# Patient Record
Sex: Male | Born: 1940 | Race: White | Hispanic: No | Marital: Married | Smoking: Current every day smoker
Health system: Southern US, Community
[De-identification: ages and names within clinical notes are randomized; demographics above are authoritative.]

## PROBLEM LIST (undated history)

## (undated) DIAGNOSIS — I701 Atherosclerosis of renal artery: Secondary | ICD-10-CM

## (undated) DIAGNOSIS — I712 Thoracic aortic aneurysm, without rupture, unspecified: Secondary | ICD-10-CM

## (undated) DIAGNOSIS — Z720 Tobacco use: Secondary | ICD-10-CM

## (undated) DIAGNOSIS — I2510 Atherosclerotic heart disease of native coronary artery without angina pectoris: Secondary | ICD-10-CM

## (undated) DIAGNOSIS — I10 Essential (primary) hypertension: Secondary | ICD-10-CM

## (undated) DIAGNOSIS — N419 Inflammatory disease of prostate, unspecified: Secondary | ICD-10-CM

## (undated) DIAGNOSIS — R0989 Other specified symptoms and signs involving the circulatory and respiratory systems: Secondary | ICD-10-CM

## (undated) DIAGNOSIS — R630 Anorexia: Secondary | ICD-10-CM

## (undated) DIAGNOSIS — I672 Cerebral atherosclerosis: Secondary | ICD-10-CM

## (undated) DIAGNOSIS — I714 Abdominal aortic aneurysm, without rupture, unspecified: Secondary | ICD-10-CM

## (undated) DIAGNOSIS — R5383 Other fatigue: Secondary | ICD-10-CM

## (undated) DIAGNOSIS — M5410 Radiculopathy, site unspecified: Secondary | ICD-10-CM

## (undated) DIAGNOSIS — N529 Male erectile dysfunction, unspecified: Secondary | ICD-10-CM

## (undated) DIAGNOSIS — E785 Hyperlipidemia, unspecified: Secondary | ICD-10-CM

## (undated) DIAGNOSIS — I509 Heart failure, unspecified: Secondary | ICD-10-CM

## (undated) DIAGNOSIS — I639 Cerebral infarction, unspecified: Secondary | ICD-10-CM

## (undated) HISTORY — DX: Inflammatory disease of prostate, unspecified: N41.9

## (undated) HISTORY — DX: Anorexia: R63.0

## (undated) HISTORY — DX: Atherosclerosis of renal artery: I70.1

## (undated) HISTORY — DX: Thoracic aortic aneurysm, without rupture, unspecified: I71.20

## (undated) HISTORY — DX: Tobacco use: Z72.0

## (undated) HISTORY — DX: Radiculopathy, site unspecified: M54.10

## (undated) HISTORY — DX: Hyperlipidemia, unspecified: E78.5

## (undated) HISTORY — DX: Heart failure, unspecified: I50.9

## (undated) HISTORY — DX: Abdominal aortic aneurysm, without rupture, unspecified: I71.40

## (undated) HISTORY — DX: Essential (primary) hypertension: I10

## (undated) HISTORY — DX: Male erectile dysfunction, unspecified: N52.9

## (undated) HISTORY — DX: Other fatigue: R53.83

## (undated) HISTORY — DX: Abdominal aortic aneurysm, without rupture: I71.4

## (undated) HISTORY — DX: Other specified symptoms and signs involving the circulatory and respiratory systems: R09.89

## (undated) HISTORY — DX: Atherosclerotic heart disease of native coronary artery without angina pectoris: I25.10

## (undated) HISTORY — DX: Cerebral infarction, unspecified: I63.9

## (undated) HISTORY — DX: Thoracic aortic aneurysm, without rupture: I71.2

## (undated) HISTORY — DX: Cerebral atherosclerosis: I67.2

---

## 1997-10-03 ENCOUNTER — Ambulatory Visit (HOSPITAL_COMMUNITY): Admission: RE | Admit: 1997-10-03 | Discharge: 1997-10-03 | Payer: Self-pay | Admitting: Gastroenterology

## 1998-10-27 ENCOUNTER — Ambulatory Visit (HOSPITAL_COMMUNITY): Admission: RE | Admit: 1998-10-27 | Discharge: 1998-10-27 | Payer: Self-pay | Admitting: Cardiology

## 1998-10-27 ENCOUNTER — Encounter: Payer: Self-pay | Admitting: Cardiology

## 1998-10-27 HISTORY — PX: CARDIAC CATHETERIZATION: SHX172

## 1998-11-08 ENCOUNTER — Encounter: Payer: Self-pay | Admitting: Surgery

## 1998-11-10 ENCOUNTER — Inpatient Hospital Stay (HOSPITAL_COMMUNITY): Admission: RE | Admit: 1998-11-10 | Discharge: 1998-11-16 | Payer: Self-pay | Admitting: Surgery

## 1998-11-10 ENCOUNTER — Encounter: Payer: Self-pay | Admitting: Surgery

## 1998-11-11 ENCOUNTER — Encounter: Payer: Self-pay | Admitting: Surgery

## 1998-11-12 ENCOUNTER — Encounter: Payer: Self-pay | Admitting: Surgery

## 1998-11-13 ENCOUNTER — Encounter: Payer: Self-pay | Admitting: Surgery

## 1998-11-14 ENCOUNTER — Encounter: Payer: Self-pay | Admitting: Surgery

## 1999-03-05 HISTORY — PX: THORACIC AORTIC ANEURYSM REPAIR: SHX799

## 2007-05-07 ENCOUNTER — Ambulatory Visit (HOSPITAL_BASED_OUTPATIENT_CLINIC_OR_DEPARTMENT_OTHER): Admission: RE | Admit: 2007-05-07 | Discharge: 2007-05-07 | Payer: Self-pay | Admitting: General Surgery

## 2007-05-07 ENCOUNTER — Encounter (INDEPENDENT_AMBULATORY_CARE_PROVIDER_SITE_OTHER): Payer: Self-pay | Admitting: General Surgery

## 2007-10-19 ENCOUNTER — Emergency Department (HOSPITAL_COMMUNITY): Admission: EM | Admit: 2007-10-19 | Discharge: 2007-10-19 | Payer: Self-pay | Admitting: *Deleted

## 2007-11-04 ENCOUNTER — Ambulatory Visit: Payer: Self-pay | Admitting: Vascular Surgery

## 2007-11-06 ENCOUNTER — Ambulatory Visit: Payer: Self-pay | Admitting: Vascular Surgery

## 2007-11-06 ENCOUNTER — Ambulatory Visit (HOSPITAL_COMMUNITY): Admission: RE | Admit: 2007-11-06 | Discharge: 2007-11-06 | Payer: Self-pay | Admitting: Vascular Surgery

## 2007-11-18 DIAGNOSIS — I509 Heart failure, unspecified: Secondary | ICD-10-CM

## 2007-11-18 DIAGNOSIS — I10 Essential (primary) hypertension: Secondary | ICD-10-CM

## 2007-11-18 HISTORY — DX: Essential (primary) hypertension: I10

## 2007-11-18 HISTORY — DX: Heart failure, unspecified: I50.9

## 2007-11-25 ENCOUNTER — Ambulatory Visit: Payer: Self-pay | Admitting: Vascular Surgery

## 2007-12-08 ENCOUNTER — Encounter: Payer: Self-pay | Admitting: Vascular Surgery

## 2007-12-08 ENCOUNTER — Ambulatory Visit: Payer: Self-pay | Admitting: Vascular Surgery

## 2007-12-08 ENCOUNTER — Inpatient Hospital Stay (HOSPITAL_COMMUNITY): Admission: RE | Admit: 2007-12-08 | Discharge: 2007-12-14 | Payer: Self-pay | Admitting: Vascular Surgery

## 2007-12-08 HISTORY — PX: ABDOMINAL AORTIC ANEURYSM REPAIR: SUR1152

## 2007-12-08 HISTORY — PX: PR VEIN BYPASS GRAFT,AORTO-FEM-POP: 35551

## 2007-12-30 ENCOUNTER — Ambulatory Visit: Payer: Self-pay | Admitting: Vascular Surgery

## 2008-06-29 ENCOUNTER — Encounter: Admission: RE | Admit: 2008-06-29 | Discharge: 2008-06-29 | Payer: Self-pay | Admitting: Vascular Surgery

## 2008-06-29 ENCOUNTER — Ambulatory Visit: Payer: Self-pay | Admitting: Vascular Surgery

## 2009-01-20 IMAGING — CR DG CHEST 1V PORT
1 series · 1 of 1 positions shown · non-contrast
Comparison: 12/02/2007.

CLINICAL DATA: Status post repair of abdominal aortic aneurysm.

PORTABLE CHEST - 1 VIEW

[view not recorded]
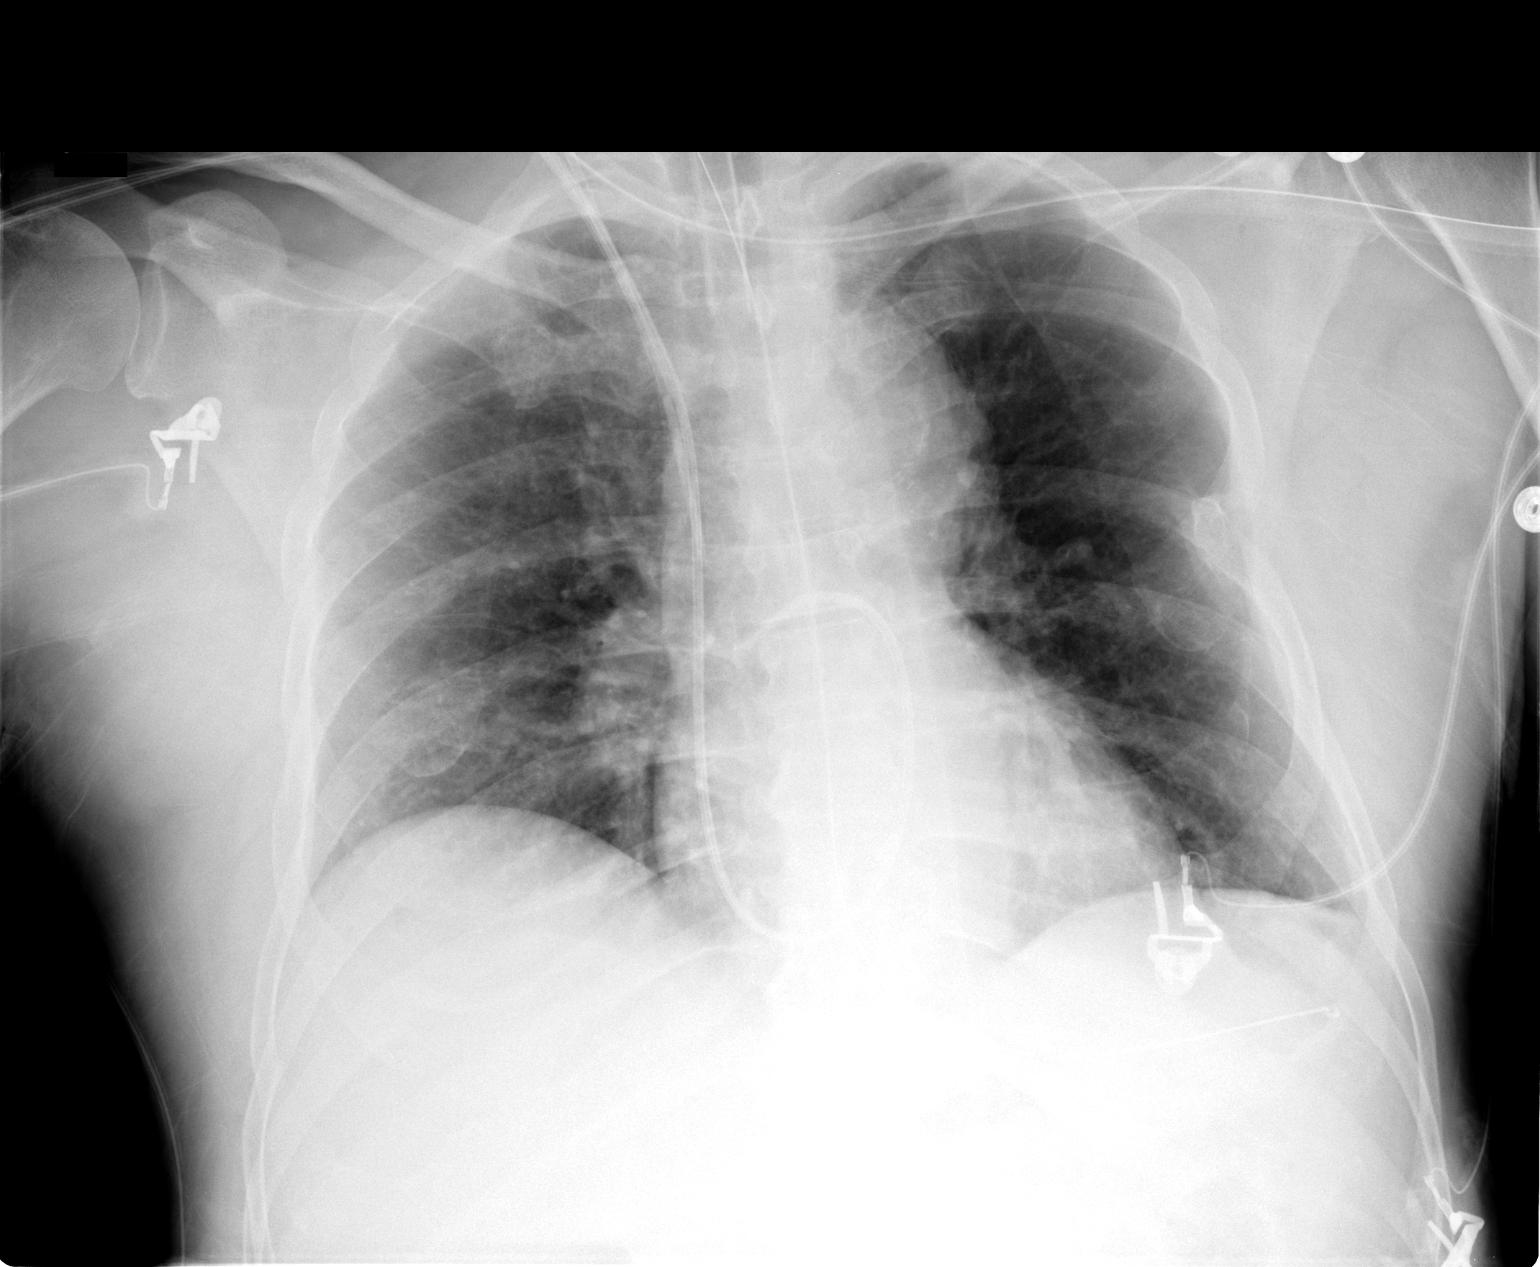

[1 of 1 positions shown; findings below may reference images not displayed]

FINDINGS: The patient is intubated.  Endotracheal tube terminates
5.4 cm above the carina.  A Swan-Ganz catheter enters via a right
IJ sheath.  Terminates in the proximal right pulmonary artery.  A
second right IJ line terminates in the mid SVC.  An NG tube
terminates in the fundus the stomach.  The patient is status post
left thoracotomy with previous resection of the left fifth rib.
Left sixth rib fracture is healing.  Lung volumes are low without
focal airspace disease.
IMPRESSION: 1.  Low lung volumes without focal airspace disease.
2.  Support apparatus as above.
3.  Status post left thoracotomy.  The

## 2009-01-22 IMAGING — CR DG CHEST 1V PORT
1 series · 1 of 1 positions shown · non-contrast
Comparison: 12/09/2007

CLINICAL DATA: AAA

PORTABLE CHEST - 1 VIEW

[view not recorded]
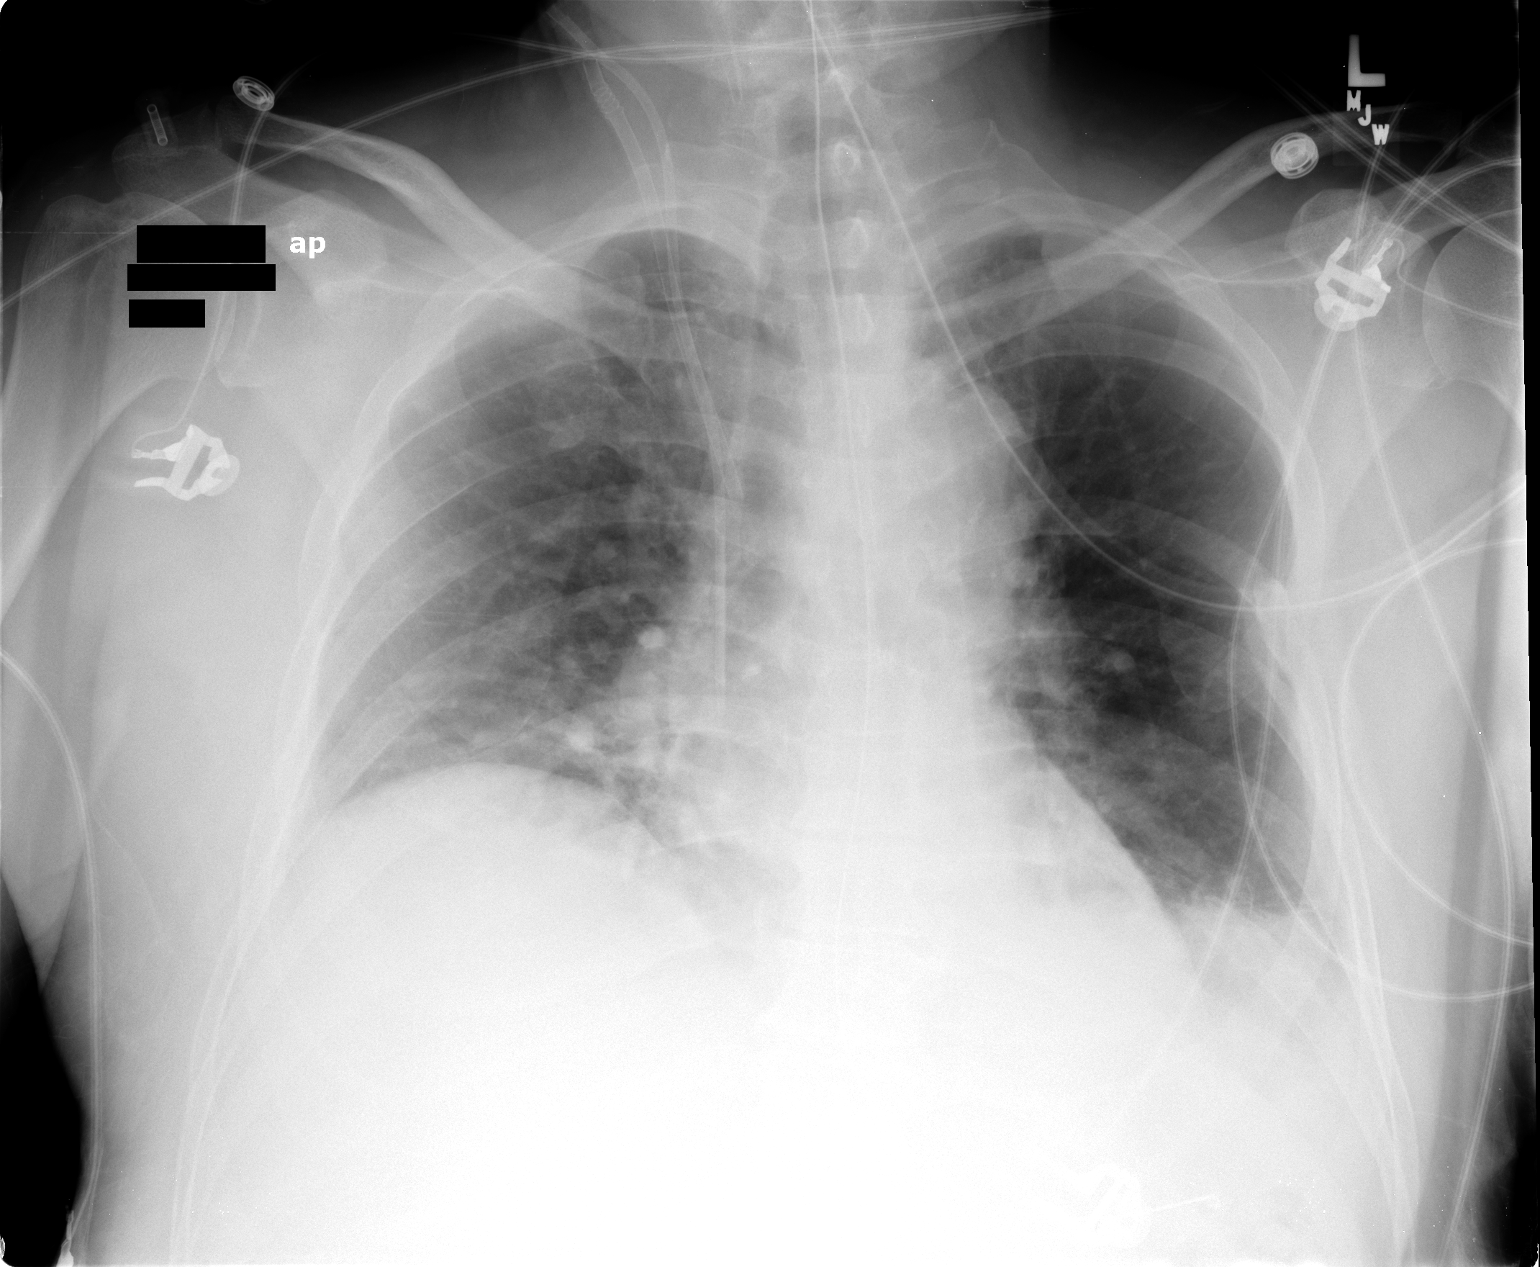

[1 of 1 positions shown; findings below may reference images not displayed]

FINDINGS: Mild atelectasis at the bases.  No airspace opacities.
No pneumothorax.  Normal cardiomediastinal silhouette.  Right
jugular CVC is near the SVC - right atrial junction.  SGC has been
removed.  The catheter sheath is in the SVC.  ETT is been removed.
NG tube tip is in the proximal stomach.
IMPRESSION: Mild atelectasis at the bases.

## 2009-06-29 ENCOUNTER — Ambulatory Visit: Payer: Self-pay | Admitting: Vascular Surgery

## 2010-06-21 ENCOUNTER — Other Ambulatory Visit (INDEPENDENT_AMBULATORY_CARE_PROVIDER_SITE_OTHER): Payer: Medicare Other

## 2010-06-21 DIAGNOSIS — Z48812 Encounter for surgical aftercare following surgery on the circulatory system: Secondary | ICD-10-CM

## 2010-06-21 DIAGNOSIS — I701 Atherosclerosis of renal artery: Secondary | ICD-10-CM

## 2010-06-21 NOTE — Procedures (Unsigned)
RENAL ARTERY DUPLEX EVALUATION  INDICATION:  Follow up renal artery stenosis.  HISTORY: Diabetes:  No. Cardiac:  Stent. Hypertension:  Yes. Smoking:  Currently.  RENAL ARTERY DUPLEX FINDINGS: Aorta-Proximal:  59 cm/s Aorta-Mid:  111 cm/s Aorta-Distal:  84 cm/s Celiac Artery Origin:  N/V cm/s SMA Origin:  N/V cm/s                                   RIGHT               LEFT Renal Artery Origin:             83                  132 Renal Artery Proximal:           110                 132 Renal Artery Mid:                124                 98 Renal Artery Distal:             155                 58 Hilar Acceleration Time (AT): Renal-Aortic Ratio (RAR):        2.63                2.24 Kidney Size:                     12.3                12.4 End Diastolic Ratio (EDR): Resistive Index (RI):            0.68 / 0.63         0.74 / 0.76  IMPRESSION: 1. Technically difficult and limited evaluation due to excessive bowel     gas present. 2. The celiac axis and superior mesenteric artery are not visualized     due to previous statement. 3. Aorto-bi-femoral graft appears patent with native proximal aorta     measuring 2.38 cm x 2.42 cm and graft measuring 2.96 cm x 2.97 cm. 4. Bilateral kidneys are polycystic in nature with the right kidney's     larger cyst measuring 4.8 cm x 5.3 cm and the left kidney with     larger cyst measuring 3.3 cm x 3.7 cm. 5. Patent bilateral renal arteries, with renal aortic ratios less than     3.5 consistent with less than 60% stenosis bilaterally.  ___________________________________________ Janetta Hora Fields, MD  SH/MEDQ  D:  06/21/2010  T:  06/21/2010  Job:  [id number]

## 2010-07-17 NOTE — Procedures (Signed)
RENAL ARTERY DUPLEX EVALUATION   INDICATION:  Follow up reimplementation of left renal artery.   HISTORY:  Diabetes:  No.  Cardiac:  Stent, aneurysm of aorta.  Hypertension:  Yes.  Smoking:  Yes.   RENAL ARTERY DUPLEX FINDINGS:  Aorta-Proximal:  49 cm/s  Aorta-Mid:  91 cm/s  Aorta-Distal:  endograft  Celiac Artery Origin:  188 cm/s  SMA Origin:  111 cm/s                                    RIGHT               LEFT  Renal Artery Origin:             98 cm/s             Not visualized  Renal Artery Proximal:           192 cm/s            86 cm/s  Renal Artery Mid:                138 cm/s            121 cm/s  Renal Artery Distal:             161 cm/s            80 cm/s  Hilar Acceleration Time (AT):  Renal-Aortic Ratio (RAR):        3.9                 2.5  Kidney Size:                     11.3 cm             11.1 cm  End Diastolic Ratio (EDR):  Resistive Index (RI):            0.63                0.70   IMPRESSION:  1. Patent bilateral renal arteries.  2. Bilateral kidneys appear normal in size with cystic structures      noted.  3. Bilateral resistive indices appear normal.  4. Right renal-artery ratio suggests >60% stenosis; however,      velocities in proximal aorta may be effected by the endograft in      place.   ___________________________________________  Janetta Hora. Fields, MD   CB/MEDQ  D:  06/29/2009  T:  06/29/2009  Job:  [id number]

## 2010-07-17 NOTE — Assessment & Plan Note (Signed)
OFFICE VISIT   Carl Gomez, DAURIA  DOB:  [birth year]                                       12/30/2007  AOZHY#:[id number]   The patient returns for postoperative follow-up today.  He underwent  repair of a pararenal abdominal aortic aneurysm with aorta bi-common  iliac artery bypass grafting.  He had reimplantation of his left renal  artery and reimplantation of his inferior mesenteric artery with  bilateral common iliac artery endarterectomy.  This was all done on  December 08, 2007.  He had an uneventful postoperative recovery.  He  returns today for further follow-up.  He has no significant complaints  other than anorexia.  He states that he is able to eat without pain.  However, he states that food just does not look or taste good to him.  He denies any symptoms of claudication.  He denies chest pain.  He  becomes short of breath easily with minimal activity.  He is trying to  increase his activity overall.  He is able to hold down solids and  liquids and is having normal bowel movements.   PHYSICAL EXAMINATION:  Today, blood pressure is 113/72, pulse 59  regular.  Abdomen incision is well-healed, there is no evidence of  hernia.  He has 2+ femoral pulses and 2+ left dorsalis pedis pulse.  He  has absent left dorsalis pedis pulse.  He has absent posterior tibial  pulses bilaterally.   He had bilateral ABIs performed today which were 0.95 on the right and  1.08 on the left.  The Doppler wave forms were triphasic bilaterally.   I reassured the patient today that overall his anorexia symptoms should  improve with time.  I also told him that he should continue to push  himself physically to increase his endurance and stamina over time.  This also should improve.  His weight today was 161.7 pounds.  Will make  sure to re-weight him on his follow-up appointment.  He will follow up  with me in 6 months' time with a repeat CT angio at that time to assess  his  renal vasculature as well as his bypass graft.  He also has a  visceral segment of aorta which is ectatic that we will need to follow  long term.   Janetta Hora. Fields, MD  Electronically Signed   CEF/MEDQ  D:  12/31/2007  T:  12/31/2007  Job:  [id number]   cc:   Alfonse Alpers. Dagoberto Ligas, M.D.

## 2010-07-17 NOTE — Discharge Summary (Signed)
NAME:  Carl Gomez, Carl Gomez NO.:  [id number]   MEDICAL RECORD NO.:  [id number]          PATIENT TYPE:  INP   LOCATION:  2030                         FACILITY:  [facility]   PHYSICIAN:  Janetta Hora. Fields, MD  DATE OF BIRTH:  1940/07/23   DATE OF ADMISSION:  12/08/2007  DATE OF DISCHARGE:  12/14/2007                               DISCHARGE SUMMARY   FINAL DISCHARGE DIAGNOSES:  1. Abdominal aortic aneurysm.  2. Hypertension.  3. Dyslipidemia.  4. History of atrial fibrillation.  5. Mild renal insufficiency.  6. Status post repair of descending thoracic aneurysm by Dr. Laneta Simmers.   PROCEDURES PERFORMED:  1. Repair of pararenal abdominal aortic aneurysm.  2. Reimplantation of left renal artery.  3. Reimplantation of IMA.  4. Bilateral common iliac artery endarterectomies.  5. Aorta to bicommon iliac artery grafting with a 20 x 10-mm Dacron      graft by Dr. Darrick Penna on December 08, 2007.   COMPLICATIONS:  None.   CONDITION ON DISCHARGE:  Stable and improving.   DISCHARGE MEDICATIONS:  He is instructed to resume all previous  medications consisting of:  1. Caduet 10/80 mg p.o. daily.  2. Zetia 10 mg p.o. daily.  3. Ramipril 10 mg p.o. daily.  4. Metoprolol 50 mg p.o. daily.  5. WelChol 625 mg 2 p.o. daily.  6. Niaspan 500 mg p.o. daily.  7. Aspirin 81 mg p.o. daily.  8. Xanax 0.1 mg p.r.n. agitation, anxiety.  9. He is given a prescription for Percocet 5/325 1 p.o. q.4 h. p.r.n.      pain total #40 were given.   DISPOSITION:  He is being discharged to home in stable condition with  his wounds healing well.  He is given careful instructions regarding his  activities and his wound care.  He is to see Dr. Darrick Penna in 2 weeks with  ABIs.   Brief identifying statements for complete details, please refer the  typed history and physical.  Briefly, this is a very pleasant 70-year-  old gentleman who was referred to Dr. Darrick Penna with an abdominal aortic  aneurysm.  Dr. Darrick Penna felt  that he should undergo repair.  He was  informed of the risks and benefits of the procedure and after careful  consideration, he elected to proceed with surgery.   HOSPITAL COURSE:  Preoperative workup was completed as an outpatient.  He was brought in through same-day surgery and underwent the  aforementioned repair of his abdominal aortic aneurysm.  For complete  details, please refer the typed operative report.  The procedure was  without complications, and he was returned to the intensive care unit in  critical, but stable condition.  Mechanical ventilation was weaned.  He  was extubated.  He was able to be transferred to a bed on a surgical  convalescent floor by the second postoperative day.  His activity level  and diet were advanced as tolerated.  On December 14, 2007, he was  walking and taking p.o.'s.  His gut was functioning.  He was desirous of  discharge and was discharged to home in stable condition.  Wilmon Arms, PA      Janetta Hora. Fields, MD  Electronically Signed    KEL/MEDQ  D:  12/14/2007  T:  12/14/2007  Job:  [id number]

## 2010-07-17 NOTE — Op Note (Signed)
NAME:  Carl Gomez, Carl Gomez               ACCOUNT NO.:  [id number]   MEDICAL RECORD NO.:  [id number]          PATIENT TYPE:  AMB   LOCATION:  SDS                          FACILITY:  [facility]   PHYSICIAN:  Janetta Hora. Fields, MD  DATE OF BIRTH:  [birth year]   DATE OF PROCEDURE:  11/06/2007  DATE OF DISCHARGE:  11/06/2007                               OPERATIVE REPORT   PROCEDURE:  Aortogram with bilateral lower extremity runoff.   PREOPERATIVE DIAGNOSIS:  Abdominal aortic aneurysm.   POSTOPERATIVE DIAGNOSIS:  Abdominal aortic aneurysm.   ANESTHESIA:  Local with IV sedation.   OPERATIVE DETAILS:  After obtaining informed consent, the patient  brought to the [facility] lab.  The patient was placed in supine position on the  angio table.  Local anesthesia was infiltrated over the right common  femoral artery.  A Majestic needle was then used to cannulate the right  common femoral artery and a 0.035 Wholey wire advanced into the right  common iliac system under fluoroscopic guidance.  Next, a 5-French  sheath was placed over the guidewire in the right common femoral artery.  The [facility] wire was then advanced up into the abdominal aorta and a 5-  French pigtail catheter was placed over this.  The guidewire was removed  and 5-French pigtail catheter was then used to perform an aortogram.  Aortogram shows an ectatic abdominal aorta.  There are single left and  right renal arteries.  These appear to come off a segment of the aorta  just above the aneurysm.  The aorta is ectatic above the renal arteries  and becomes aneurysmal below the renal arteries.  However, there does  appear to be a 1 cm or so segment just above the renal arteries which  may be suitable for clamping and there was also a small segment of  normal aorta just below the renal arteries.   Next, the pigtail catheter was pulled down near the aortic bifurcation.  Pelvic angiogram was performed.  This shows a high-grade right common  iliac  artery stenosis of approximately 70%.  The left common iliac  artery is patent.  Of note, the inferior mesenteric artery is also  patent.  The left and right external iliac and internal iliac arteries  are patent.  Next, a lower extremity runoff was obtained through the  pigtail catheter.  This shows bilateral patent common femoral and  profunda femoris arteries.  The superficial femoral artery is patent  bilaterally.  However, there is a 50-70% stenosis of the right  superficial femoral artery just above the adductor hiatus.  There is  some mild-to-moderate atherosclerotic changes of the left superficial  femoral artery at a similar location on the left side.  Left and right  popliteal arteries are patent bilaterally.  Anterior tibial artery  origin and tibioperoneal trunk, peroneal and posterior tibial arteries  are patent in their origin.  There appears to be 3-vessel runoff to the  foot bilaterally.   A lateral aortogram was also obtained.  This shows a patent celiac and  superior mesenteric artery with no significant narrowing.  There  is some  tapering of the celiac artery just after its origin most likely due to  arcuate ligament.  There is some poststenotic dilatation; however, did  suggest this may actually have some stenosis of the celiac artery.  If  this is actual stenosis that represents 50%.   On the lateral aortogram, also there seemed to be a fairly normal-sized  segment of aorta where the renal arteries come off and again this shows  that there may be an area suitable for clamping just below the superior  mesenteric artery with enough aortic cuff left for sewing of an  infrarenal aortic graft.   Additional views of the lower extremity are also available, done with a  supercine technique, which showed the right superficial femoral artery  stenosis in a little bit higher quality.  Again, there is 3-vessel  runoff to the foot bilaterally.   Next, the pigtail catheter was  pulled back over a guidewire.  The 5-  French sheath was left in place to be pulled on the holding area.  The  patient tolerated the procedure well and there were no complications.  The patient was taken to holding area in stable condition.   OPERATIVE FINDINGS:  1. Juxtarenal abdominal aortic aneurysm with segment of aorta, which      seems suitable for clamping just below the superior mesenteric      artery above the renal arteries with a small amount of aortic cuff,      which should be suitable for grafting below the renal arteries.  2. 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  3. High-grade stenosis of midsection of right superficial femoral      artery.  4. Three-vessel runoff to the foot bilaterally.  5. 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  6. Widely patent superior mesenteric artery and inferior mesenteric      artery.      Janetta Hora. Fields, MD  Electronically Signed     CEF/MEDQ  D:  11/08/2007  T:  11/09/2007  Job:  [id number]

## 2010-07-17 NOTE — Assessment & Plan Note (Signed)
OFFICE VISIT   Carl Gomez, Carl Gomez  DOB:  Sep 15, 1940                                       11/04/2007  WUJWJ#:[id number]   The patient is a 70 year old male sent for evaluation by Dr. Dagoberto Ligas for  abdominal aortic aneurysm.  This has been asymptomatic.  He denies any  abdominal or back pain.  His history is significant for repair of a  thoracic aneurysm by Dr. Laneta Simmers approximately 8-10 years ago.  He denies  any family history of aneurysms.  He has had some left hip pain but this  seems chronic in nature.   PAST MEDICAL HISTORY:  Is otherwise remarkable for:  1. Hypertension.  2. Elevated cholesterol.  3. He also has a history of coronary artery disease and previously had      circumflex and right coronary artery disease, PTCA in 1991.  4. He also has a history some mild renal insufficiency with creatinine      anywhere from 1.3 to 2.  5. He also has a history of some prostatitis.  6. Erectile dysfunction.  7. Back pain with radiculopathy.   FAMILY HISTORY:  Unremarkable.   SOCIAL HISTORY:  He is married, he has 2 children.  He is retired.  He  currently smokes 1 pack of cigarettes per day.  He does not consume  alcohol regularly.   REVIEW OF SYSTEMS:  He is 5 feet 9 inches, 175 pounds.  CARDIAC/PULMONARY/GI/RENAL/VASCULAR/NEUROLOGIC/ORTHOPEDIC/PSYCHIATRIC/EN  T/HEMATOLOGIC REVIEW OF SYSTEMS:  Are otherwise negative.   MEDICATIONS:  1. Caduet 10/80 once daily.  2. Zetia 10 mg once a day.  3. Ramipril 10 mg once a day.  4. Metoprolol 50 mg once a day.  5. Welchol 625 mg 2 daily.  6. Niaspan 500 mg once a day.  7. Aspirin 80 mg once a day.   ALLERGIES:  He is allergic to codeine which causes him to have an upset  stomach.   PHYSICAL EXAM:  The blood pressure is 141/84 in the left arm, pulse is  56 and regular.  HEENT:  Unremarkable.  He has 2+ carotid pulses without bruit.  CHEST:  Clear to auscultation.  CARDIAC EXAM:  Regular rate and rhythm,  without murmur.  ABDOMEN:  Soft, nontender, nondistended with a pulsatile mass in the  epigastrium.  EXTREMITIES:  He has 2+ radial pulses bilaterally.  He has a 1+ right  femoral pulse and a 2+ left femoral pulse.  He has a 1+ right popliteal,  a 2+ left popliteal pulse.  He has absent pedal pulses in the right  foot.  He has 2+ dorsalis pedis and posterior tibial pulse on the left.  The feet are pink, warm and well-perfused.   He had a CT scan of the abdomen and pelvis performed on 10/19/2007, this  was done without contrast.  This showed a 5.5 cm juxtarenal or pararenal  aneurysm with no evidence of rupture.  It appears that the left renal  artery is most likely coming off of the aneurysm and there is minimal  aortic neck adjacent to the right renal artery.  Celiac and superior  mesenteric arteries appeared patent.   I had a lengthy discussion today with the patient about the risk of  rupture for an aneurysm of this size.  However, repairing this will not  be without risk and it is  most likely going to involve clamping above  the renal arteries as well as either bypass or reimplantation of at  least the left renal artery.  In light of this, I believe he needs an  aortogram and lower extremity runoff to further define exactly what the  operative approach will be.  I discussed the risks, benefits, possible  complications and procedure details of aortogram with runoff with the  patient today.  This is scheduled for Friday 11/06/2007.  We will also  need to discuss his preoperative cardiac status with his cardiologist,  prior to the procedure for his aneurysm repair.   Janetta Hora. Fields, MD  Electronically Signed   CEF/MEDQ  D:  11/05/2007  T:  11/05/2007  Job:  [id number]   cc:   Alfonse Alpers. Dagoberto Ligas, M.D.

## 2010-07-17 NOTE — Op Note (Signed)
NAME:  Carl Gomez, Carl Gomez NO.:  [id number]   MEDICAL RECORD NO.:  [id number]          PATIENT TYPE:  AMB   LOCATION:  DSC                          FACILITY:  [facility]   PHYSICIAN:  Anselm Pancoast. Weatherly, M.D.DATE OF BIRTH:  1940/05/18   DATE OF PROCEDURE:  05/07/2007  DATE OF DISCHARGE:                               OPERATIVE REPORT   PREOPERATIVE DIAGNOSIS:  Multiple sebaceous cyst, the largest is nearly  4 cm, and then there were two that were 2 cm and one less than 1 cm,  that were on the back and the smaller one is on the left lateral neck.   PROCEDURE:  Excision of multiple sebaceous cysts with primary closure.   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 Gegick, M.D. because of  multiple sebaceous cysts that he desired to have removed.  The patient  had one that was about the size of a plum removed by Abigail Miyamoto,  M.D. about four years earlier that appears to have recurred and I  recommended that we remove all of these with local anesthesia in the  minor surgery room.  I did start him on Keflex last evening even though  none of them are actively infected.   DESCRIPTION OF PROCEDURE:  He comes in this morning with the patient  first laying on his side.  The little area that was kind of on the  lateral lower left side of the neck was first used with Betadine prep,  anesthetized with a few mL of Xylocaine and this excised and the two 4-0  nylon sutures.  Next I turned him prone, prepped the whole back with  Betadine solution and then used the 1% Xylocaine with adrenaline, a  total of about 35 mL was used to anesthetize all of the other three.  Then the smaller one which is the one that had been previously excised  was the first removed.  The little bleeders were controlled  predominantly with a few 4-0 Vicryl sutures for hemostasis and then the  wound was closed with interrupted 3-0 nylon.   Next the next to largest,  intermediate size which was on the right shoulder was removed in a  similar manner.  It was probably a full 2 cm in size and then the last  one was the largest, nearly 4 cm in size and it required a little more  sutures of the 4-0 Vicryl for hemostasis.  The incisions were closed  with nylon and the patient tolerated the procedure without problems.  Triple antibiotic ointment was applied and he was ready to be released  and I will see him back in the office in approximately 10 days for  suture removal.  If he is having any increased tenderness in the wounds,  I can see him in the office on Monday and he will complete four  additional days of Keflex p.o.  The patient tolerated the procedure  nicely and we will give him some Vicodin for pain if he needs it and  limit  his activity this weekend.           ______________________________  Anselm Pancoast. Zachery Dakins, M.D.     WJW/MEDQ  D:  05/07/2007  T:  05/07/2007  Job:  [id number]   cc:   Alfonse Alpers. Dagoberto Ligas, M.D.

## 2010-07-17 NOTE — Op Note (Signed)
NAME:  WAYLEN, DEPAOLO NO.:  [id number]   MEDICAL RECORD NO.:  [id number]          PATIENT TYPE:  INP   LOCATION:  2304                         FACILITY:  [facility]   PHYSICIAN:  Janetta Hora. Fields, MD  DATE OF BIRTH:  April 12, 1940   DATE OF PROCEDURE:  12/08/2007  DATE OF DISCHARGE:                               OPERATIVE REPORT   PROCEDURES:  1. Repair of pararenal abdominal aortic aneurysm with aortobi-common      iliac artery bypass graft.  2. Reimplantation of left renal artery.  3. Reimplantation of inferior mesenteric artery.  4. Bilateral common iliac artery endarterectomy.   Preoperative Diagnosis: Pararenal abdominal aortic aneurysm  Postoperative Diagnosis: same   Anesthesia: General  Asst: Iva Boop, PA-C   OPERATIVE FINDINGS:  1. Suprarenal clamp time 42 minutes.  2. 20 x 10 mm Dacron graft.  3. Left renal artery implanted approximately 2-3 cm below the proximal      anastomosis on the Dacron graft.  4. Inferior mesenteric artery reimplanted along the left limb of the      aortobiiliac graft.   OPERATIVE DETAILS:  After obtaining informed consent, the patient was  taken to the operating room.  The patient was placed in supine position  in the operating table.  After induction of general anesthesia and  endotracheal intubation, a Foley catheter and nasogastric tubes were  placed.  Next, the patient was prepped from the nipples down to the  knees.  A midline laparotomy incision was made extending from the  xiphoid down to the level of the pubis.  Incision was carried down  through the subcutaneous tissues.  The fascia and peritoneum were  incised for the full length of the incision.  Exploratory laparotomy of  the abdomen revealed normal liver, small bowel, and colon.  Next, the  greater omentum and transverse colon was reflected superiorly and the  small bowel was reflected to the right.  Retroperitoneum was entered.  Retroperitoneal attachments  were taken down with cautery.  The duodenum  was mobilized to the patient's right.  Omni retractor was brought up on  the operative field for assistance on retraction.  Dissection was then  carried up to level of the aortic neck.  Preoperative imaging had shown  that the aneurysm was pararenal in character.  Next, the left renal  artery was identified and dissected free circumferentially.  This was  coming off the left side of the aneurysm.  Vessel loop was placed around  this.  Attention was then turned to the right renal artery.  This was  dissected free circumferentially.  This was coming off the more proximal  aorta and was not involved in the aneurysm, but the aneurysm began to  develop just below this.  A site for clamping proximally was dissected  free just above the level of both renal arteries.  There was a suitable  segment of aorta in this location below the superior mesenteric artery,  but above the renal arteries.  Left renal vein was mobilized on its  anterior and posterior surface.  A vessel loop was placed  around this.  The left gonadal and adrenal branches were left intact.   Next, attention was turned toward the distal portion of the dissection.  Dissection was carried down to level of the inferior mesenteric artery.  This artery was approximately 2 mm in diameter.  It was dissected free  circumferentially and a vessel loop placed around this.  Dissection was  then carried down to the level of aortic bifurcation.  The iliac  arteries were heavily calcified bilaterally.  Dissection was carried  down to the level of the iliac bifurcation bilaterally.  The external  iliac and internal iliac arteries were dissected free circumferentially  and vessel loops placed around these.  The patient was then given 7000  units of intravenous heparin.  A suprarenal clamp was applied after  occluding the left and right renal arteries with vessel loops.  The  right internal and external  iliac arteries were controlled with clamps  and the left common iliac artery was also controlled with clamp.  Aneurysm was then entered.  All of the thrombotic material was  evacuated.  There were no significant lumbar arteries bleeding.  A 20 x  10-mm Dacron graft was then brought in the operative field.  The neck of  the aneurysm was dissected free circumferentially.  A large cuff of  aortic tissue was dissected free circumferentially and the left renal  artery detached from the aorta.  The right renal artery was identified  and allowed to back bleed thoroughly and then reoccluded.  The 20-mm  Dacron graft was then sewn end to graft to end of aorta using a running  3-0 Prolene suture with a circumferential felt cuff.  At completion of  anastomosis, this was tested proximally.  There was a small area leaking  posteriorly and the suture line was slightly loose.  This was then  tightened up on and an additional suture placed and this had the  posterior wall hemostatic.  The right renal artery was inspected with  Doppler and found to have good flow.  Clamp time for this portion of the  case was 42 minutes.  Attention was then turned to the left renal  artery.  The cuff of aortic tissue was trimmed back so that the Carrel  patch could be fashioned out of the left renal artery.  A Satinsky clamp  was then used in a side-biting fashion on the lateral wall of the aortic  graft and a longitudinal opening was made with an 11 blade.  This was  then extended with a 5-mm punch.  A running 6-0 Prolene suture was then  used to anastomose the cuff of the left renal artery to the left side of  the Dacron graft approximately 2 cm below the anastomosis.  At  completion of anastomosis, flow was then restored to the left renal  artery and this was also inspected with Doppler and found to have good  flow.  Attention was then turned to the iliac arteries.  The right  common iliac artery was divided in its  midportion.  The artery was  heavily calcified, fairly nonabsorbable.  It was opened longitudinally  down to the level of the iliac bifurcation.  An endarterectomy was then  performed in the right common iliac artery and the posterior wall was of  much better quality after this.  The right and internal iliac and right  external iliac arteries were patent and back bled well.  The 10-mm right  limb of the graft  was then trimmed to length and then sewn end of graft  to the end of artery using a running 5-0 Prolene suture.  Just prior to  completion anastomosis, it was forebled, back bled, and thoroughly  flushed.  Anastomosis was secured.  Clamps released.  Flow was restored  to the right lower extremity.  The patient had a right femoral pulse  immediately.  Attention was then turned to the left leg.  In similar  fashion, the left common iliac artery was divided in its mid section.  Again, this had a large amount of calcified plaque and the artery was  opened longitudinally, and a left common iliac artery endarterectomy was  also performed.  The left external iliac artery was a slightly smaller  caliber, but both of these had a patent lumen and had backbleeding.  The  left limb of the graft was then trimmed to length and beveled.  This was  then sewn end to graft to end of left common iliac artery at the iliac  bifurcation using running 5-0 Prolene suture.  Just prior to completion  anastomosis, it was forebled, backbled, and thoroughly flushed.  There  was minimal backbleeding from the external iliac artery initially.  Therefore, #4 Fogarty catheter was passed down the left external iliac  artery for approximately 30 cm and then brought back.  There was no  thrombus retrieved, but there was good backbleeding at this point.  The  remainder of the anastomosis was secured and flow was restored to the  left leg.  It should be noted there was a pressure drop of approximately  20 mm systolic  pressure with opening in the right leg as well as the  left leg.  Both of these anastomoses were made hemostatic.  Attention  was then turned to the inferior mesenteric artery.  Vessel loop was  loosened and there was minimal backbleeding from this.  This was probed  with the 2 dilator and still there was minimal backbleeding.  It was  decided to reimplant this.  This was dissected free circumferentially  and a cuff of aortic tissue was dissected free and trimmed around the  inferior mesenteric artery.  The left common iliac limb of the bypass  graft was then occluded proximally and distally with perfect peripheral  DeBakey clamps.  Longitudinal opening was made in the lateral aspect of  the left iliac limb and the inferior mesenteric artery reimplanted using  a running 6-0 Prolene suture.  Just prior to completion anastomosis, it  was forebled, backbled, and thoroughly flushed.  Anastomosis was secured  and clamps were released.  Flow was restored to the left leg and the  left inferior mesenteric artery.  There was good Doppler flow in the  inferior mesenteric artery after completion of anastomosis.  Next,  hemostasis was obtained.  The patient had been given an additional 8000  units of heparin during the course of the case and this was all reversed  with a total of 130 mg protamine.  After hemostasis had been obtained,  the retroperitoneal tissues in the aneurysm sac were closed with running  3-0 Prolene suture.  There was a portion of the aortic graft proximally  that was still exposed and there was not enough retroperitoneal tissue  to close over this.  A bovine pericardial patch was brought in the  operative field and used to cover the proximal aspect of the graft.  The  left renal vein had been divided and oversewn on  the proximal and distal  end with a running 5-0 Prolene suture to allow exposure and access to  the aortic neck.  Again, it was noted that the gonadal and adrenal  vein  on the left side was left intact for renal outflow.  The patient's urine  output picked up immediately after unclamping of the renal arteries.  The patient was in stable condition at this point.  The fascia was  reapproximated using a running #1 PDS suture.  Wound was thoroughly  irrigated with normal saline solution.  Skin was then closed with  staples.  The patient tolerated the procedure well and there were no  complications.  Instruments, sponge, and needle counts were correct at  the end of the case.  The patient was taken to the intensive care unit  in stable condition.      Janetta Hora. Fields, MD  Electronically Signed     CEF/MEDQ  D:  12/08/2007  T:  12/09/2007  Job:  [id number]

## 2010-07-17 NOTE — Assessment & Plan Note (Signed)
OFFICE VISIT   Carl Gomez, Carl Gomez  DOB:  [birth year]                                       11/25/2007  [id number]#:[id number]   The patient returns for followup today for consideration of repair of  his abdominal aortic aneurysm.  He underwent an arteriogram 1 week ago  to further evaluate the anatomy of the renal arteries.  This again shows  that the left renal artery is either coming off of the aneurysm or just  adjacent to it.  It does appear that there is suitable clamping room  between the superior mesenteric artery and renal aortic arteries to fix  the aneurysm.  He recently underwent a cardiology evaluation by Dr.  Nanetta Batty with [facility] and was thought to be at low risk  for myocardial events.  He continues to deny any abdominal or back pain.   PAST MEDICAL HISTORY:  His past medical history is significant for  hypertension, elevated cholesterol, coronary artery disease with PTCA in  1991.  He has mild renal insufficiency with creatinine anywhere from 1.3  to 1.2 but this was 1.6 on the day of his arteriogram.  He also has a  history of prostatitis, erectile dysfunction and back pain with  radiculopathy.   PAST SURGICAL HISTORY:  He has previously had a descending thoracic  aneurysm repair by Dr. Laneta Simmers approximately 8 years ago.   FAMILY HISTORY:  Unremarkable.   SOCIAL HISTORY:  He is married and has two children.  He is retired and  currently smokes one pack of cigarettes per day.   REVIEW OF SYSTEMS:  He is 5 feet 9 inches and 175 pounds.  Review of  systems are otherwise negative.   MEDICATIONS:  1. Continue to include Caduet 10/80 once a day.  2. Zetia 10 mg once a day.  3. Ramipril 10 mg once a day.  4. Metoprolol 50 mg once a day.  5. Welchol 625 mg two daily.  6. Niaspan 500 mg once a day.  7. Aspirin 81 mg once a day.   ALLERGIES:  He is allergic to CODEINE which causes him to have an upset  stomach.   PHYSICAL  EXAMINATION:  Vital signs:  Blood pressure is 155/85 in the  left arm, pulse is 51 and regular.  HEENT:  Unremarkable.  Neck:  Neck  has 2+ carotid pulses without bruit.  Chest:  Clear to auscultation.  Cardiac:  Exam is regular rate and rhythm without murmur.  Abdomen:  Soft, nontender, nondistended with a pulsatile mass in the epigastrium.  Extremities:  He has a 1+ right femoral pulse and 2+ left femoral pulse.  He has absent pedal pulses in the right foot and he has a 2+ dorsalis  pedis pulse and posterior tibial pulse in the left foot.   In summary, the patient has a juxta or pararenal aneurysm which is 5.5  cm in diameter.  This seems to be amenable to approach from an abdominal  repair with suprarenal clamping and possibly bypass to his left renal  artery or reimplantation of this.  I discussed the operative repair with  the patient today as well as risk of renal failure, bleeding, infection,  myocardial events and death.  He understands and agrees to proceed.  This is scheduled for 12/08/2007.  He was also given a prescription for  Ativan today as he has been very anxious regarding the repair of his  aneurysm.   Janetta Hora. Fields, MD  Electronically Signed   CEF/MEDQ  D:  11/25/2007  T:  11/26/2007  Job:  [id number]   cc:   Alfonse Alpers. Dagoberto Ligas, M.D.

## 2010-07-17 NOTE — Assessment & Plan Note (Signed)
OFFICE VISIT   NAZIER, NEYHART  DOB:  06-24-1940                                       06/29/2008  [id number]#:[id number]   Mr. Chopin returns for followup today.  He underwent repair of pararenal  abdominal aortic aneurysm with an aorto to bi-common iliac artery bypass  grafting and reimplantation of his left renal artery and inferior  mesenteric artery on December 08, 2007.  He was last seen December 30, 2007.  He was still having some anorexia and fatigue symptoms at that  time.   I am pleased to report today that his anorexia symptoms have improved  significantly.  He has gained close to 14 pounds since his last office  visit.  He currently weighs 175 pounds.  Additionally, he has quit  smoking.  He states that his appetite has not completely returned to  normal but overall he is much improved.   PHYSICAL EXAMINATION:  VITAL SIGNS:  Blood pressure 159/69 in the left  arm, pulse is 60 and regular.  HEENT:  Unremarkable.  NECK:  Has 2+ carotid pulses without bruits.  CHEST:  Clear to auscultation.  CARDIAC:  Regular rate and rhythm without murmur.  ABDOMEN:  Has a well-healed midline laparotomy scar.  He has no obvious  hernia defect.  There is no palpable pulsatile mass.  He has 2+ femoral  pulses bilaterally.   He had a CT scan of the abdomen and pelvis today which showed that the  left renal artery and right renal arteries are both widely patent.  The  IMA reimplantation is also patent.  He has some mild to moderate  atherosclerotic changes in his iliac arteries bilaterally but no  significant stenosis.   Overall, Mr. Brusca is doing well.  He will have a renal duplex scan in  one year's time.  We will also have PA and lateral chest x-ray that time  for some pleural thickening that he had in the past.  We will return  sooner if he has any problems prior to this.   Janetta Hora. Fields, MD  Electronically Signed   CEF/MEDQ  D:  06/29/2008  T:   06/30/2008  Job:  [id number]   cc:   Alfonse Alpers. Dagoberto Ligas, M.D.

## 2010-11-23 DIAGNOSIS — R0989 Other specified symptoms and signs involving the circulatory and respiratory systems: Secondary | ICD-10-CM

## 2010-11-23 HISTORY — DX: Other specified symptoms and signs involving the circulatory and respiratory systems: R09.89

## 2010-12-03 LAB — BLOOD GAS, ARTERIAL
Bicarbonate: 23.5
Drawn by: 206361
FIO2: 0.21
Patient temperature: 98.6
pCO2 arterial: 38.2
pH, Arterial: 7.405
pO2, Arterial: 103 — ABNORMAL HIGH

## 2010-12-03 LAB — URINALYSIS, ROUTINE W REFLEX MICROSCOPIC
Bilirubin Urine: NEGATIVE
Glucose, UA: NEGATIVE
Urobilinogen, UA: 0.2
pH: 6

## 2010-12-03 LAB — TYPE AND SCREEN
ABO/RH(D): A POS
Antibody Screen: NEGATIVE

## 2010-12-03 LAB — CBC
Hemoglobin: 14.2
MCHC: 33.9
WBC: 10.8 — ABNORMAL HIGH

## 2010-12-03 LAB — COMPREHENSIVE METABOLIC PANEL
ALT: 19
AST: 17
GFR calc Af Amer: >60
Potassium: 4.9

## 2010-12-03 LAB — PROTIME-INR: Prothrombin Time: 13.6

## 2010-12-04 LAB — POCT I-STAT 7, (LYTES, BLD GAS, ICA,H+H)
Acid-base deficit: 3 — ABNORMAL HIGH
Acid-base deficit: 8 — ABNORMAL HIGH
Bicarbonate: 22
Calcium, Ion: 1.02 — ABNORMAL LOW
HCT: 29 — ABNORMAL LOW
Hemoglobin: 12.6 — ABNORMAL LOW
Hemoglobin: 9.9 — ABNORMAL LOW
O2 Saturation: 100
O2 Saturation: 100
O2 Saturation: 100
O2 Saturation: 100
Patient temperature: 35.7
Patient temperature: 35.9
Potassium: 4.8
Potassium: 4.9
Potassium: 5.8 — ABNORMAL HIGH
Sodium: 137
TCO2: 21
TCO2: 23
TCO2: 23
TCO2: 26
TCO2: 26
pCO2 arterial: 37.8
pCO2 arterial: 38.8
pCO2 arterial: 43.6
pH, Arterial: 7.259 — ABNORMAL LOW
pH, Arterial: 7.367
pH, Arterial: 7.371
pH, Arterial: 7.409
pH, Arterial: 7.417
pO2, Arterial: 232 — ABNORMAL HIGH

## 2010-12-04 LAB — CBC
HCT: 30.9 — ABNORMAL LOW
HCT: 32.9 — ABNORMAL LOW
Hemoglobin: 10.6 — ABNORMAL LOW
Hemoglobin: 10.7 — ABNORMAL LOW
Hemoglobin: 11.1 — ABNORMAL LOW
Hemoglobin: 12.5 — ABNORMAL LOW
MCHC: 33.6
MCHC: 33.7
MCHC: 34.2
MCV: 90.8
MCV: 92.2
Platelets: 71 — ABNORMAL LOW
Platelets: 75 — ABNORMAL LOW
RBC: 4.26
RDW: 14.4
RDW: 14.6
RDW: 14.8
RDW: 14.9
RDW: 15
WBC: 10

## 2010-12-04 LAB — BASIC METABOLIC PANEL
CO2: 24
Calcium: 6.8 — ABNORMAL LOW
Calcium: 7.6 — ABNORMAL LOW
Chloride: 110
Creatinine, Ser: 1.89 — ABNORMAL HIGH
GFR calc Af Amer: 45 — ABNORMAL LOW
GFR calc non Af Amer: 36 — ABNORMAL LOW
Glucose, Bld: 100 — ABNORMAL HIGH
Glucose, Bld: 93
Potassium: 3.7
Potassium: 5.2 — ABNORMAL HIGH
Sodium: 136
Sodium: 139
Sodium: 140

## 2010-12-04 LAB — POCT I-STAT 3, ART BLOOD GAS (G3+)
Acid-base deficit: 6 — ABNORMAL HIGH
Bicarbonate: 19.8 — ABNORMAL LOW
Bicarbonate: 19.9 — ABNORMAL LOW
Patient temperature: 35.7
Patient temperature: 37
TCO2: 21
pCO2 arterial: 40.7
pH, Arterial: 7.289 — ABNORMAL LOW

## 2010-12-04 LAB — COMPREHENSIVE METABOLIC PANEL
ALT: 15
Albumin: 1.7 — ABNORMAL LOW
Alkaline Phosphatase: 27 — ABNORMAL LOW
Alkaline Phosphatase: 38 — ABNORMAL LOW
BUN: 26 — ABNORMAL HIGH
Calcium: 7.2 — ABNORMAL LOW
Glucose, Bld: 102 — ABNORMAL HIGH
Glucose, Bld: 171 — ABNORMAL HIGH
Potassium: 4
Potassium: 4.9
Sodium: 136
Total Protein: 3 — ABNORMAL LOW
Total Protein: 3.5 — ABNORMAL LOW

## 2010-12-04 LAB — CARDIAC PANEL(CRET KIN+CKTOT+MB+TROPI)
CK, MB: 1.8
Total CK: 130
Total CK: 157
Troponin I: 0.03

## 2010-12-04 LAB — AMYLASE: Amylase: 658 — ABNORMAL HIGH

## 2010-12-04 LAB — APTT: aPTT: 38 — ABNORMAL HIGH

## 2010-12-04 LAB — PROTIME-INR
INR: 1.6 — ABNORMAL HIGH
Prothrombin Time: 19.9 — ABNORMAL HIGH

## 2010-12-04 LAB — MAGNESIUM: Magnesium: 1.5

## 2010-12-04 LAB — PHOSPHORUS: Phosphorus: 4.1

## 2010-12-05 LAB — POCT I-STAT, CHEM 8
BUN: 31 — ABNORMAL HIGH
Calcium, Ion: 1.25
Chloride: 111
Creatinine, Ser: 1.6 — ABNORMAL HIGH
TCO2: 26

## 2011-12-12 DIAGNOSIS — I672 Cerebral atherosclerosis: Secondary | ICD-10-CM

## 2011-12-12 HISTORY — DX: Cerebral atherosclerosis: I67.2

## 2012-01-23 ENCOUNTER — Encounter: Payer: Self-pay | Admitting: Vascular Surgery

## 2012-02-03 ENCOUNTER — Encounter: Payer: Self-pay | Admitting: Vascular Surgery

## 2012-02-11 ENCOUNTER — Other Ambulatory Visit: Payer: Self-pay | Admitting: *Deleted

## 2012-02-11 DIAGNOSIS — Z48812 Encounter for surgical aftercare following surgery on the circulatory system: Secondary | ICD-10-CM

## 2012-02-11 DIAGNOSIS — I739 Peripheral vascular disease, unspecified: Secondary | ICD-10-CM

## 2012-02-13 ENCOUNTER — Encounter (INDEPENDENT_AMBULATORY_CARE_PROVIDER_SITE_OTHER): Payer: Medicare Other | Admitting: *Deleted

## 2012-02-13 ENCOUNTER — Encounter: Payer: Self-pay | Admitting: Vascular Surgery

## 2012-02-13 ENCOUNTER — Ambulatory Visit (INDEPENDENT_AMBULATORY_CARE_PROVIDER_SITE_OTHER): Payer: Medicare Other | Admitting: Vascular Surgery

## 2012-02-13 VITALS — BP 141/69 | HR 43 | Ht 69.0 in | Wt 172.5 lb

## 2012-02-13 DIAGNOSIS — Z48812 Encounter for surgical aftercare following surgery on the circulatory system: Secondary | ICD-10-CM

## 2012-02-13 DIAGNOSIS — I739 Peripheral vascular disease, unspecified: Secondary | ICD-10-CM | POA: Insufficient documentation

## 2012-02-13 DIAGNOSIS — I714 Abdominal aortic aneurysm, without rupture, unspecified: Secondary | ICD-10-CM | POA: Insufficient documentation

## 2012-02-13 DIAGNOSIS — I723 Aneurysm of iliac artery: Secondary | ICD-10-CM

## 2012-02-13 NOTE — Progress Notes (Signed)
Patient is a 71 year old male who returns for followup today. He previously underwent aortobiiliac bypass with left renal bypass for aneurysm and 2009. He denies any symptoms of claudication. He states that he intermittently has his creatinine checked and does not note any abnormalities. He denies abdominal or back pain. Overall he is doing well.  Other chronic medical problems include hypertension hyperlipidemia coronary artery disease. These are all currently stable. He does have chronic back pain which is stable.  He does have a history of carotid stenosis which is currently being followed by Dr. Allyson Sabal.  Past Medical History  Diagnosis Date  . AAA (abdominal aortic aneurysm)   . Hypertension   . Hyperlipidemia   . Stroke   . Renal artery stenosis   . Anorexia   . Fatigue   . CAD (coronary artery disease)     with PTCA  in 1991  . Prostatitis   . ED (erectile dysfunction)   . Radicular low back pain    Past Surgical History  Procedure Date  . Abdominal aortic aneurysm repair Oct. 6, 2009  . Pr vein bypass graft,aorto-fem-pop Oct. 6, 2009    Bi-common iliac Artery BPG  . Thoracic aortic aneurysm repair 2001    by Dr. Laneta Simmers   Current Outpatient Prescriptions on File Prior to Visit  Medication Sig Dispense Refill  . amLODipine (NORVASC) 10 MG tablet Take 1 tablet by mouth daily.      Marland Kitchen aspirin 81 MG tablet Take 81 mg by mouth daily.      Marland Kitchen atorvastatin (LIPITOR) 80 MG tablet Take 80 mg by mouth daily.      . metoprolol (LOPRESSOR) 50 MG tablet Take 50 mg by mouth 2 (two) times daily.      . niacin (NIASPAN) 500 MG CR tablet Take 500 mg by mouth at bedtime.      . ramipril (ALTACE) 10 MG capsule Take 10 mg by mouth daily.        Allergies  Allergen Reactions  . Codeine     Review of systems: A full 12 point review of systems was performed the patient all of which were negative  Physical exam: Filed Vitals:   02/13/12 1047  BP: 141/69  Pulse: 43  Height: 5\' 9"  (1.753 m)   Weight: 172 lb 8 oz (78.245 kg)  SpO2: 99%   General: Well-developed well-nourished male in no acute distress  HEENT: Normal  Neck: 2+ carotid pulses without bruit  Chest: Clear to auscultation  Cardiac: Regular rate and rhythm without murmur  Abdomen: Soft nontender nondistended no evidence of hernia no mass  Extremities: 2+ femoral pulses bilaterally, slightly full popliteal pulses  Neuro: Symmetric upper extremity and lower assuring motor strength which is 5 over 5 Skin: No rash  Data: The patient had bilateral ABIs performed today. Right side was 0.85 left 1.04 waveforms on the right biphasic left triphasic I reviewed and interpreted this study  Assessment: Doing well status post previous aneurysm repair.  Plan: The patient will followup in one year.  At that time he will be 5 years out from his aneurysm repair and we will repeat his CT Angio to make sure he has no evidence of proximal pseudoaneurysm development.  We will also repeat his ABIs and do a renal duplex in 1 year.  We will also do a duplex scan of both popliteal arteries to rule out aneurysm.  Fabienne Bruns, MD [facility] Office: [phone] Pager: [phone]

## 2012-03-12 ENCOUNTER — Ambulatory Visit: Payer: No Typology Code available for payment source | Admitting: Vascular Surgery

## 2012-03-12 ENCOUNTER — Other Ambulatory Visit: Payer: No Typology Code available for payment source

## 2012-07-17 ENCOUNTER — Telehealth: Payer: Self-pay | Admitting: Physician Assistant

## 2012-07-17 NOTE — Telephone Encounter (Signed)
Left appointment date and time with wife .

## 2012-07-20 ENCOUNTER — Encounter: Payer: Self-pay | Admitting: Physician Assistant

## 2012-07-20 ENCOUNTER — Encounter: Payer: Self-pay | Admitting: Cardiovascular Disease

## 2012-07-20 ENCOUNTER — Ambulatory Visit (INDEPENDENT_AMBULATORY_CARE_PROVIDER_SITE_OTHER): Payer: Medicare Other | Admitting: Physician Assistant

## 2012-07-20 VITALS — BP 142/66 | HR 47 | Ht 69.0 in | Wt 169.9 lb

## 2012-07-20 DIAGNOSIS — E785 Hyperlipidemia, unspecified: Secondary | ICD-10-CM

## 2012-07-20 DIAGNOSIS — R001 Bradycardia, unspecified: Secondary | ICD-10-CM

## 2012-07-20 DIAGNOSIS — I10 Essential (primary) hypertension: Secondary | ICD-10-CM

## 2012-07-20 DIAGNOSIS — I779 Disorder of arteries and arterioles, unspecified: Secondary | ICD-10-CM | POA: Insufficient documentation

## 2012-07-20 DIAGNOSIS — I498 Other specified cardiac arrhythmias: Secondary | ICD-10-CM

## 2012-07-20 DIAGNOSIS — Z720 Tobacco use: Secondary | ICD-10-CM

## 2012-07-20 DIAGNOSIS — I739 Peripheral vascular disease, unspecified: Secondary | ICD-10-CM

## 2012-07-20 DIAGNOSIS — F17200 Nicotine dependence, unspecified, uncomplicated: Secondary | ICD-10-CM

## 2012-07-20 NOTE — Assessment & Plan Note (Signed)
Patient's heart rate by EKGs 47 beats per minute. He is on Lopressor 50 mg twice daily. He is however asymptomatic. Continue current dosing

## 2012-07-20 NOTE — Assessment & Plan Note (Signed)
Tobacco cessation between 3-10 minutes.  Discussed options for help with tobacco cessation. The patient does not appear eager to quit

## 2012-07-20 NOTE — Assessment & Plan Note (Signed)
Repeat carotid Dopplers in October 2014.

## 2012-07-20 NOTE — Assessment & Plan Note (Signed)
Blood pressure controlled on amlodipine and metoprolol.  

## 2012-07-20 NOTE — Assessment & Plan Note (Signed)
Patient currently taking Lipitor 80 mg daily. Last lipid panel was August of 2013. Can repeat at next office visit.

## 2012-07-20 NOTE — Progress Notes (Signed)
Date:  07/20/2012   ID:  Carl Gomez, DOB May 01, 1940, MRN [id number]  PCP:  Katy Apo, MD  Primary Cardiologist:  Allyson Sabal     History of Present Illness: Carl Gomez is a 72 y.o. male history of peripheral vascular disease, hypertension, hyperlipidemia, tobacco abuse. Patient has carotid artery disease greater on the left and followed by carotid Dopplers annually. He has a history of remote PCI back in 1992 and was recathed in August 2000 revealing essentially normal coronary arteries and normal LV function. He had a thoracic aortic aneurysm resection by Dr. Erroll Luna bar a 14 years ago and underwent aortobifemoral bypass grafting by Dr. Leonette Most fields 5 years ago. His last Myoview stress test was September 2009 ischemic. Patient presents for a six-month followup reports to be doing quite well with no nausea, vomiting, fever, chest pain, shortness of breath, orthopnea, PND, lower extremity edema, palpitations, dizziness, abdominal pain, medication. Patient reports that this past week and these did quite a bit of yard work still helping his daughter clean up after the ice during this past winter he reported no difficulties in doing this work.Marland Kitchen He reported only one episode of dizziness which was transient.   Wt Readings from Last 3 Encounters:  07/20/12 169 lb 14.4 oz (77.066 kg)  02/13/12 172 lb 8 oz (78.245 kg)     Past Medical History  Diagnosis Date  . AAA (abdominal aortic aneurysm)   . Hyperlipidemia   . Stroke   . Renal artery stenosis   . Anorexia   . Fatigue   . Prostatitis   . ED (erectile dysfunction)   . Radicular low back pain   . Cerebral atherosclerosis 12/12/2011    Carotid Duplex - Left Buld/Proximal ICA-moderate amount fibrous plaque, 50-69% reduction  . Bruit 11/23/2010    Asymptomatic, Left Bulb/Proximal ICA - Severe fibrous plaque, less than or equal to 70% reduction  . CAD (coronary artery disease)     with PTCA  in 1991  . CHF (congestive heart failure)  11/18/2007    Lexiscan - EF 71%, perfusion defect in the inferior myocardial region is consistent with diaphragmatic attenuation, remaining myocardium demonstrates normal perfusion, no evidence of ischemia or infarct. No ECG changes, EKG negative for ischemia  . Hypertension 11/18/2007    2D Echo - EF >55%, no significant valvular pathology    Current Outpatient Prescriptions  Medication Sig Dispense Refill  . amLODipine (NORVASC) 10 MG tablet Take 1 tablet by mouth daily.      Marland Kitchen aspirin 81 MG tablet Take 81 mg by mouth daily.      Marland Kitchen atorvastatin (LIPITOR) 80 MG tablet Take 80 mg by mouth daily.      . metoprolol (LOPRESSOR) 50 MG tablet Take 50 mg by mouth 2 (two) times daily.      . niacin (NIASPAN) 500 MG CR tablet Take 500 mg by mouth at bedtime.      . ramipril (ALTACE) 10 MG capsule Take 10 mg by mouth daily.       No current facility-administered medications for this visit.    Allergies:    Allergies  Allergen Reactions  . Codeine     Social History:  The patient  reports that he has been smoking Cigarettes.  He has been smoking about 1.00 pack per day. He has never used smokeless tobacco. He reports that he does not drink alcohol or use illicit drugs.   ROS:  Please see the history of present illness.  All other  systems reviewed and negative.   PHYSICAL EXAM: VS:  BP 142/66  Pulse 47  Ht 5\' 9"  (1.753 m)  Wt 169 lb 14.4 oz (77.066 kg)  BMI 25.08 kg/m2 Well nourished, well developed, in no acute distress HEENT: Pupils are equal round react to light accommodation extraocular movements are intact.  Neck: Left carotid bruit, no JVDNo cervical lymphadenopathy. Cardiac: Regular rate and rhythm without murmurs rubs or gallops. Lungs:  clear to auscultation bilaterally, no wheezing, rhonchi or rales Abd: soft, nontender, positive bowel sounds all quadrants, no hepatosplenomegaly Ext: no lower extremity edema.  2+ radial and left dorsalis pedis pulses. 1+ right dorsalis pedis  pulse. Skin: warm and dry Neuro:  Grossly normal, strength 5 out of 5 equal upper and lower extremities  EKG:  Sinus bradycardia rate 47 beats per minute  ASSESSMENT AND PLAN:  Problem List Items Addressed This Visit   Carotid arterial disease: left - Primary     Repeat carotid Dopplers in October 2014.    Relevant Orders      Carotid duplex   Tobacco abuse     Tobacco cessation between 3-10 minutes.  Discussed options for help with tobacco cessation. The patient does not appear eager to quit    Hyperlipidemia     Patient currently taking Lipitor 80 mg daily. Last lipid panel was August of 2013. Can repeat at next office visit.    Essential hypertension     Blood pressure controlled on amlodipine and metoprolol.    Bradycardia, sinus     Patient's heart rate by EKGs 47 beats per minute. He is on Lopressor 50 mg twice daily. He is however asymptomatic. Continue current dosing     Other Visit Diagnoses   PVD (peripheral vascular disease)        Relevant Orders       EKG 12-Lead

## 2012-12-14 ENCOUNTER — Ambulatory Visit (HOSPITAL_COMMUNITY)
Admission: RE | Admit: 2012-12-14 | Discharge: 2012-12-14 | Disposition: A | Discharge status: Home / Self Care | Payer: Medicare Other | Source: Ambulatory Visit | Attending: Cardiovascular Disease | Admitting: Cardiovascular Disease

## 2012-12-14 DIAGNOSIS — I6529 Occlusion and stenosis of unspecified carotid artery: Secondary | ICD-10-CM

## 2012-12-14 NOTE — Progress Notes (Signed)
Carotid Duplex Completed. Mehar Sagen, BS, RDMS, RVT  

## 2012-12-21 ENCOUNTER — Ambulatory Visit (INDEPENDENT_AMBULATORY_CARE_PROVIDER_SITE_OTHER): Payer: Medicare Other | Admitting: Cardiovascular Disease

## 2012-12-21 ENCOUNTER — Encounter: Payer: Self-pay | Admitting: *Deleted

## 2012-12-21 ENCOUNTER — Telehealth: Payer: Self-pay | Admitting: *Deleted

## 2012-12-21 ENCOUNTER — Encounter: Payer: Self-pay | Admitting: Cardiovascular Disease

## 2012-12-21 VITALS — BP 148/70 | HR 45 | Ht 68.0 in | Wt 165.0 lb

## 2012-12-21 DIAGNOSIS — I779 Disorder of arteries and arterioles, unspecified: Secondary | ICD-10-CM

## 2012-12-21 DIAGNOSIS — F17200 Nicotine dependence, unspecified, uncomplicated: Secondary | ICD-10-CM

## 2012-12-21 DIAGNOSIS — I2510 Atherosclerotic heart disease of native coronary artery without angina pectoris: Secondary | ICD-10-CM | POA: Insufficient documentation

## 2012-12-21 DIAGNOSIS — I10 Essential (primary) hypertension: Secondary | ICD-10-CM

## 2012-12-21 DIAGNOSIS — I714 Abdominal aortic aneurysm, without rupture, unspecified: Secondary | ICD-10-CM

## 2012-12-21 DIAGNOSIS — I739 Peripheral vascular disease, unspecified: Secondary | ICD-10-CM

## 2012-12-21 DIAGNOSIS — E785 Hyperlipidemia, unspecified: Secondary | ICD-10-CM

## 2012-12-21 DIAGNOSIS — I6529 Occlusion and stenosis of unspecified carotid artery: Secondary | ICD-10-CM

## 2012-12-21 DIAGNOSIS — Z720 Tobacco use: Secondary | ICD-10-CM

## 2012-12-21 NOTE — Telephone Encounter (Signed)
Message copied by Marella Bile on Mon Dec 21, 2012  4:23 PM ------      Message from: Runell Gess      Created: Sun Dec 20, 2012 12:50 PM       No change from prior study. Repeat in 6 months ------

## 2012-12-21 NOTE — Assessment & Plan Note (Signed)
Well-controlled on current medications 

## 2012-12-21 NOTE — Assessment & Plan Note (Signed)
Asymptomatic, followed by duplex ultrasound semiannually in our office.

## 2012-12-21 NOTE — Assessment & Plan Note (Signed)
Status post remote PCI back in 1992 recath August of 2000 revealing essentially normal coronary arteries and normal LV function. He had a Myoview stress test performed 11/18/07 which was nonischemic. He denies chest pain or shortness of breath.

## 2012-12-21 NOTE — Progress Notes (Signed)
12/21/2012 Carl Gomez   03/01/1941  [id number]  Primary Physician Katy Apo, MD Primary Cardiologist: Runell Gess MD Carl Gomez   HPI:  The patient is a delightful 72 year old thin appearing married Caucasian male father of 2, grandfather to 2 grandchildren who I last saw in the office 6 months ago. He was formerly a patient of Dr. Pierre Gomez. He has had a history of a remote PCI back in 1992 and re-cathed in October 27, 1998 revealing essentially normal coronary arteries and normal LV function. He does continue to smoke a pack per day and has done this for the last 50 years. His other problems include hypertension and hyperlipidemia. He had a thoracic aortic aneurysm resected by Dr. Evelene Gomez 14 years ago and underwent aortobifemoral bypass grafting by Dr. Fabienne Gomez 5 years ago. He denies chest pain, shortness of breath or claudication. His last Myoview performed November 18, 2007 was nonischemic, and his most recent lab work in August of last year  revealed a total cholesterol of 154, LDL of 92 and HDL of 32.     Current Outpatient Prescriptions  Medication Sig Dispense Refill  . amLODipine (NORVASC) 10 MG tablet Take 1 tablet by mouth daily.      Marland Kitchen aspirin 81 MG tablet Take 81 mg by mouth daily.      Marland Kitchen atorvastatin (LIPITOR) 80 MG tablet Take 80 mg by mouth daily.      . metoprolol (LOPRESSOR) 50 MG tablet Take 50 mg by mouth 2 (two) times daily.      . niacin (NIASPAN) 500 MG CR tablet Take 500 mg by mouth at bedtime.      . ramipril (ALTACE) 10 MG capsule Take 10 mg by mouth daily.       No current facility-administered medications for this visit.    Allergies  Allergen Reactions  . Codeine     History   Social History  . Marital Status: Married    Spouse Name: N/A    Number of Children: N/A  . Years of Education: N/A   Occupational History  . Not on file.   Social History Main Topics  . Smoking status: Current Every Day  Smoker -- 1.00 packs/day    Types: Cigarettes  . Smokeless tobacco: Never Used  . Alcohol Use: No  . Drug Use: No  . Sexual Activity: Not on file   Other Topics Concern  . Not on file   Social History Narrative  . No narrative on file     Review of Systems: General: negative for chills, fever, night sweats or weight changes.  Cardiovascular: negative for chest pain, dyspnea on exertion, edema, orthopnea, palpitations, paroxysmal nocturnal dyspnea or shortness of breath Dermatological: negative for rash Respiratory: negative for cough or wheezing Urologic: negative for hematuria Abdominal: negative for nausea, vomiting, diarrhea, bright red blood per rectum, melena, or hematemesis Neurologic: negative for visual changes, syncope, or dizziness All other systems reviewed and are otherwise negative except as noted above.    Blood pressure 148/70, pulse 45, height 5\' 8"  (1.727 m), weight 165 lb (74.844 kg).  General appearance: alert and no distress Neck: no adenopathy, no JVD, supple, symmetrical, trachea midline and thyroid not enlarged, symmetric, no tenderness/mass/nodules Lungs: clear to auscultation bilaterally Heart: regular rate and rhythm, S1, S2 normal, no murmur, click, rub or gallop Extremities: extremities normal, atraumatic, no cyanosis or edema  EKG sinus bradycardia at 45 without ST or T wave changes  ASSESSMENT  AND PLAN:   Coronary artery disease Status post remote PCI back in 1992 recath August of 2000 revealing essentially normal coronary arteries and normal LV function. He had a Myoview stress test performed 11/18/07 which was nonischemic. He denies chest pain or shortness of breath.  Abdominal aneurysm without mention of rupture Status post aortobifemoral bypass grafting followed by Dr. Leonette Most fields  Essential hypertension Well-controlled on current medications  Hyperlipidemia On statin therapy followed by Dr. Nehemiah Settle, his PCP  Carotid arterial  disease: left Asymptomatic, followed by duplex ultrasound semiannually in our office.      Runell Gess MD FACP,FACC,FAHA, [facility] 12/21/2012 9:41 AM

## 2012-12-21 NOTE — Patient Instructions (Addendum)
  We will see you back in follow up in 1 year with Dr Allyson Sabal.  Dr Allyson Sabal has ordered carotid dopplers to be done in 1 year. (correction:  The dopplers actually need to be done in 6 months from now)

## 2012-12-21 NOTE — Telephone Encounter (Signed)
Order for carotid doppler placed 

## 2012-12-21 NOTE — Assessment & Plan Note (Signed)
On statin therapy followed by Dr. Nehemiah Settle, his PCP

## 2012-12-21 NOTE — Assessment & Plan Note (Signed)
Status post aortobifemoral bypass grafting followed by Dr. Leonette Most fields

## 2013-01-14 ENCOUNTER — Other Ambulatory Visit: Payer: Self-pay | Admitting: Vascular Surgery

## 2013-01-14 DIAGNOSIS — I714 Abdominal aortic aneurysm, without rupture: Secondary | ICD-10-CM

## 2013-01-14 DIAGNOSIS — Z48812 Encounter for surgical aftercare following surgery on the circulatory system: Secondary | ICD-10-CM

## 2013-01-15 ENCOUNTER — Other Ambulatory Visit: Payer: Self-pay | Admitting: Vascular Surgery

## 2013-01-15 DIAGNOSIS — I714 Abdominal aortic aneurysm, without rupture, unspecified: Secondary | ICD-10-CM

## 2013-01-15 DIAGNOSIS — Z48812 Encounter for surgical aftercare following surgery on the circulatory system: Secondary | ICD-10-CM

## 2013-01-15 DIAGNOSIS — I723 Aneurysm of iliac artery: Secondary | ICD-10-CM

## 2013-02-15 ENCOUNTER — Other Ambulatory Visit: Payer: Self-pay | Admitting: Vascular Surgery

## 2013-02-15 LAB — CREATININE, SERUM: Creat: 1.58 mg/dL — ABNORMAL HIGH (ref 0.50–1.35)

## 2013-02-17 ENCOUNTER — Encounter: Payer: Self-pay | Admitting: Vascular Surgery

## 2013-02-18 ENCOUNTER — Ambulatory Visit (HOSPITAL_COMMUNITY)
Admission: RE | Admit: 2013-02-18 | Discharge: 2013-02-18 | Disposition: A | Discharge status: Home / Self Care | Payer: Medicare Other | Source: Ambulatory Visit | Attending: Vascular Surgery | Admitting: Vascular Surgery

## 2013-02-18 ENCOUNTER — Ambulatory Visit (INDEPENDENT_AMBULATORY_CARE_PROVIDER_SITE_OTHER)
Admission: RE | Admit: 2013-02-18 | Discharge: 2013-02-18 | Disposition: A | Discharge status: Home / Self Care | Payer: Medicare Other | Source: Ambulatory Visit | Attending: Vascular Surgery | Admitting: Vascular Surgery

## 2013-02-18 ENCOUNTER — Other Ambulatory Visit (HOSPITAL_COMMUNITY): Payer: Medicare Other

## 2013-02-18 ENCOUNTER — Other Ambulatory Visit: Payer: Self-pay | Admitting: Vascular Surgery

## 2013-02-18 ENCOUNTER — Encounter: Payer: Self-pay | Admitting: Vascular Surgery

## 2013-02-18 ENCOUNTER — Encounter (HOSPITAL_COMMUNITY): Payer: Medicare Other

## 2013-02-18 ENCOUNTER — Ambulatory Visit
Admission: RE | Admit: 2013-02-18 | Discharge: 2013-02-18 | Disposition: A | Discharge status: Home / Self Care | Payer: Medicare Other | Source: Ambulatory Visit | Attending: Vascular Surgery | Admitting: Vascular Surgery

## 2013-02-18 ENCOUNTER — Ambulatory Visit (INDEPENDENT_AMBULATORY_CARE_PROVIDER_SITE_OTHER): Payer: Medicare Other | Admitting: Vascular Surgery

## 2013-02-18 VITALS — BP 153/64 | HR 44 | Ht 68.0 in | Wt 163.7 lb

## 2013-02-18 DIAGNOSIS — I714 Abdominal aortic aneurysm, without rupture, unspecified: Secondary | ICD-10-CM

## 2013-02-18 DIAGNOSIS — I723 Aneurysm of iliac artery: Secondary | ICD-10-CM

## 2013-02-18 DIAGNOSIS — Z48812 Encounter for surgical aftercare following surgery on the circulatory system: Secondary | ICD-10-CM

## 2013-02-18 DIAGNOSIS — I701 Atherosclerosis of renal artery: Secondary | ICD-10-CM

## 2013-02-18 MED ORDER — IOHEXOL 350 MG/ML SOLN
60.0000 mL | Freq: Once | INTRAVENOUS | Status: AC | PRN
  Administered 2013-02-18: 60 mL via INTRAVENOUS

## 2013-02-18 NOTE — Progress Notes (Signed)
Patient is a 72 year old male who returns for followup today. He previously underwent aortobiiliac bypass with left renal bypass for aneurysm and 2009. He denies any symptoms of claudication. He states that he intermittently has his creatinine checked and does not note any abnormalities. He denies abdominal or back pain. Overall he is doing well.  Other chronic medical problems include hypertension hyperlipidemia coronary artery disease. These are all currently stable. He does have chronic back pain which is stable.  He does have a history of carotid stenosis which is currently being followed by Dr. Allyson Sabal.    Past Medical History   Diagnosis  Date   .  AAA (abdominal aortic aneurysm)     .  Hypertension     .  Hyperlipidemia     .  Stroke     .  Renal artery stenosis     .  Anorexia     .  Fatigue     .  CAD (coronary artery disease)         with PTCA  in 1991   .  Prostatitis     .  ED (erectile dysfunction)     .  Radicular low back pain        Past Surgical History   Procedure  Date   .  Abdominal aortic aneurysm repair  Oct. 6, 2009   .  Pr vein bypass graft,aorto-fem-pop  Oct. 6, 2009       Bi-common iliac Artery BPG   .  Thoracic aortic aneurysm repair  2001       by Dr. Laneta Simmers      Current Outpatient Prescriptions on File Prior to Visit   Medication  Sig  Dispense  Refill   .  amLODipine (NORVASC) 10 MG tablet  Take 1 tablet by mouth daily.         Marland Kitchen  aspirin 81 MG tablet  Take 81 mg by mouth daily.         Marland Kitchen  atorvastatin (LIPITOR) 80 MG tablet  Take 80 mg by mouth daily.         .  metoprolol (LOPRESSOR) 50 MG tablet  Take 50 mg by mouth 2 (two) times daily.         .  niacin (NIASPAN) 500 MG CR tablet  Take 500 mg by mouth at bedtime.         .  ramipril (ALTACE) 10 MG capsule  Take 10 mg by mouth daily.             Allergies   Allergen  Reactions   .  Codeine       Review of systems: A full 12 point review of systems was performed the patient all of which were  negative  Physical exam:  Filed Vitals:   02/18/13 1028  BP: 153/64  Pulse: 44  Height: 5\' 8"  (1.727 m)  Weight: 163 lb 11.2 oz (74.254 kg)  SpO2: 100%   General: Well-developed well-nourished male in no acute distress  HEENT: Normal  Neck: 2+ carotid pulses without bruit  Chest: Clear to auscultation  Cardiac: Regular rate and rhythm without murmur  Abdomen: Soft nontender nondistended no evidence of hernia no mass  Extremities: 2+ femoral pulses bilaterally  Neuro: Symmetric upper extremity and lower extremity motor strength which is 5 over 5 Skin: No rash  Data: The patient had a renal artery duplex today. I reviewed and interpreted this study. Renal mass was 12 cm bilaterally. No  significant renal artery stenosis. Polycystic kidneys bilaterally. The patient also had a CT scan a few weeks ago. 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 He also had bilateral popliteal ultrasound today for full popliteal pulses felt in last exam no evidence of popliteal aneurysm.  Assessment: Doing well status post previous aneurysm repair with left renal bypass  Plan: The patient will followup in one year We will do a renal duplex in 1 year.  Dr. Allyson Sabal will continue to follow the patient's carotid stenosis.  Fabienne Bruns, MD [facility] Office: [phone] Pager: [phone]

## 2013-04-12 ENCOUNTER — Emergency Department (HOSPITAL_COMMUNITY): Payer: Medicare Other

## 2013-04-12 ENCOUNTER — Inpatient Hospital Stay (HOSPITAL_COMMUNITY)
Admission: EM | Admit: 2013-04-12 | Discharge: 2013-05-02 | DRG: 871 | Disposition: E | Payer: Medicare Other | Attending: Pulmonary Disease | Admitting: Pulmonary Disease

## 2013-04-12 ENCOUNTER — Inpatient Hospital Stay (HOSPITAL_COMMUNITY): Payer: Medicare Other

## 2013-04-12 ENCOUNTER — Encounter (HOSPITAL_COMMUNITY): Payer: Self-pay | Admitting: Emergency Medicine

## 2013-04-12 DIAGNOSIS — E870 Hyperosmolality and hypernatremia: Secondary | ICD-10-CM | POA: Diagnosis present

## 2013-04-12 DIAGNOSIS — J9690 Respiratory failure, unspecified, unspecified whether with hypoxia or hypercapnia: Secondary | ICD-10-CM

## 2013-04-12 DIAGNOSIS — R6520 Severe sepsis without septic shock: Secondary | ICD-10-CM

## 2013-04-12 DIAGNOSIS — Z66 Do not resuscitate: Secondary | ICD-10-CM | POA: Diagnosis not present

## 2013-04-12 DIAGNOSIS — I129 Hypertensive chronic kidney disease with stage 1 through stage 4 chronic kidney disease, or unspecified chronic kidney disease: Secondary | ICD-10-CM | POA: Diagnosis present

## 2013-04-12 DIAGNOSIS — R6521 Severe sepsis with septic shock: Secondary | ICD-10-CM

## 2013-04-12 DIAGNOSIS — F17200 Nicotine dependence, unspecified, uncomplicated: Secondary | ICD-10-CM | POA: Diagnosis present

## 2013-04-12 DIAGNOSIS — J189 Pneumonia, unspecified organism: Secondary | ICD-10-CM

## 2013-04-12 DIAGNOSIS — I701 Atherosclerosis of renal artery: Secondary | ICD-10-CM | POA: Diagnosis present

## 2013-04-12 DIAGNOSIS — IMO0002 Reserved for concepts with insufficient information to code with codable children: Secondary | ICD-10-CM | POA: Diagnosis not present

## 2013-04-12 DIAGNOSIS — Z7982 Long term (current) use of aspirin: Secondary | ICD-10-CM

## 2013-04-12 DIAGNOSIS — Z8249 Family history of ischemic heart disease and other diseases of the circulatory system: Secondary | ICD-10-CM

## 2013-04-12 DIAGNOSIS — Z515 Encounter for palliative care: Secondary | ICD-10-CM

## 2013-04-12 DIAGNOSIS — I2510 Atherosclerotic heart disease of native coronary artery without angina pectoris: Secondary | ICD-10-CM | POA: Diagnosis present

## 2013-04-12 DIAGNOSIS — J449 Chronic obstructive pulmonary disease, unspecified: Secondary | ICD-10-CM | POA: Diagnosis present

## 2013-04-12 DIAGNOSIS — D649 Anemia, unspecified: Secondary | ICD-10-CM | POA: Diagnosis present

## 2013-04-12 DIAGNOSIS — I369 Nonrheumatic tricuspid valve disorder, unspecified: Secondary | ICD-10-CM

## 2013-04-12 DIAGNOSIS — E872 Acidosis, unspecified: Secondary | ICD-10-CM

## 2013-04-12 DIAGNOSIS — R001 Bradycardia, unspecified: Secondary | ICD-10-CM

## 2013-04-12 DIAGNOSIS — I509 Heart failure, unspecified: Secondary | ICD-10-CM | POA: Diagnosis present

## 2013-04-12 DIAGNOSIS — N183 Chronic kidney disease, stage 3 unspecified: Secondary | ICD-10-CM | POA: Diagnosis present

## 2013-04-12 DIAGNOSIS — Z79899 Other long term (current) drug therapy: Secondary | ICD-10-CM

## 2013-04-12 DIAGNOSIS — I739 Peripheral vascular disease, unspecified: Secondary | ICD-10-CM | POA: Diagnosis present

## 2013-04-12 DIAGNOSIS — J9600 Acute respiratory failure, unspecified whether with hypoxia or hypercapnia: Secondary | ICD-10-CM

## 2013-04-12 DIAGNOSIS — G9340 Encephalopathy, unspecified: Secondary | ICD-10-CM | POA: Diagnosis present

## 2013-04-12 DIAGNOSIS — D61818 Other pancytopenia: Secondary | ICD-10-CM | POA: Diagnosis present

## 2013-04-12 DIAGNOSIS — Z885 Allergy status to narcotic agent status: Secondary | ICD-10-CM

## 2013-04-12 DIAGNOSIS — I10 Essential (primary) hypertension: Secondary | ICD-10-CM | POA: Diagnosis present

## 2013-04-12 DIAGNOSIS — R34 Anuria and oliguria: Secondary | ICD-10-CM | POA: Diagnosis present

## 2013-04-12 DIAGNOSIS — J1100 Influenza due to unidentified influenza virus with unspecified type of pneumonia: Secondary | ICD-10-CM | POA: Diagnosis present

## 2013-04-12 DIAGNOSIS — E785 Hyperlipidemia, unspecified: Secondary | ICD-10-CM | POA: Diagnosis present

## 2013-04-12 DIAGNOSIS — Z720 Tobacco use: Secondary | ICD-10-CM | POA: Diagnosis present

## 2013-04-12 DIAGNOSIS — Z8673 Personal history of transient ischemic attack (TIA), and cerebral infarction without residual deficits: Secondary | ICD-10-CM

## 2013-04-12 DIAGNOSIS — J4489 Other specified chronic obstructive pulmonary disease: Secondary | ICD-10-CM | POA: Diagnosis present

## 2013-04-12 DIAGNOSIS — J15212 Pneumonia due to Methicillin resistant Staphylococcus aureus: Secondary | ICD-10-CM | POA: Diagnosis present

## 2013-04-12 DIAGNOSIS — I779 Disorder of arteries and arterioles, unspecified: Secondary | ICD-10-CM | POA: Diagnosis present

## 2013-04-12 DIAGNOSIS — A419 Sepsis, unspecified organism: Secondary | ICD-10-CM | POA: Diagnosis present

## 2013-04-12 DIAGNOSIS — I723 Aneurysm of iliac artery: Secondary | ICD-10-CM | POA: Diagnosis present

## 2013-04-12 DIAGNOSIS — I714 Abdominal aortic aneurysm, without rupture, unspecified: Secondary | ICD-10-CM | POA: Diagnosis present

## 2013-04-12 DIAGNOSIS — N179 Acute kidney failure, unspecified: Secondary | ICD-10-CM

## 2013-04-12 DIAGNOSIS — R7309 Other abnormal glucose: Secondary | ICD-10-CM | POA: Diagnosis present

## 2013-04-12 DIAGNOSIS — E874 Mixed disorder of acid-base balance: Secondary | ICD-10-CM | POA: Diagnosis present

## 2013-04-12 LAB — CBC WITH DIFFERENTIAL/PLATELET
BASOS PCT: 1 % (ref 0–1)
Basophils Absolute: 0 10*3/uL (ref 0.0–0.1)
Basophils Absolute: 0 10*3/uL (ref 0.0–0.1)
Basophils Relative: 0 % (ref 0–1)
EOS PCT: 0 % (ref 0–5)
EOS PCT: 1 % (ref 0–5)
Eosinophils Absolute: 0 10*3/uL (ref 0.0–0.7)
Eosinophils Absolute: 0 10*3/uL (ref 0.0–0.7)
HCT: 41 % (ref 39.0–52.0)
HEMATOCRIT: 34.9 % — AB (ref 39.0–52.0)
HEMOGLOBIN: 13.9 g/dL (ref 13.0–17.0)
HEMOGLOBIN: 11.4 g/dL — AB (ref 13.0–17.0)
LYMPHS PCT: 11 % — AB (ref 12–46)
LYMPHS PCT: 60 % — AB (ref 12–46)
Lymphs Abs: 0.7 10*3/uL (ref 0.7–4.0)
Lymphs Abs: 0.7 10*3/uL (ref 0.7–4.0)
MCH: 30.8 pg (ref 26.0–34.0)
MCH: 30.3 pg (ref 26.0–34.0)
MCHC: 33.9 g/dL (ref 30.0–36.0)
MCHC: 32.7 g/dL (ref 30.0–36.0)
MCV: 90.7 fL (ref 78.0–100.0)
MCV: 92.8 fL (ref 78.0–100.0)
Monocytes Absolute: 1.1 10*3/uL — ABNORMAL HIGH (ref 0.1–1.0)
Monocytes Absolute: 0 10*3/uL — ABNORMAL LOW (ref 0.1–1.0)
Monocytes Relative: 17 % — ABNORMAL HIGH (ref 3–12)
Monocytes Relative: 4 % (ref 3–12)
NEUTROS PCT: 72 % (ref 43–77)
Neutro Abs: 4.9 10*3/uL (ref 1.7–7.7)
Neutro Abs: 0.4 10*3/uL — ABNORMAL LOW (ref 1.7–7.7)
Neutrophils Relative %: 34 % — ABNORMAL LOW (ref 43–77)
Platelets: 107 10*3/uL — ABNORMAL LOW (ref 150–400)
Platelets: 108 10*3/uL — ABNORMAL LOW (ref 150–400)
RBC: 4.52 MIL/uL (ref 4.22–5.81)
RBC: 3.76 MIL/uL — ABNORMAL LOW (ref 4.22–5.81)
RDW: 15.5 % (ref 11.5–15.5)
RDW: 16 % — ABNORMAL HIGH (ref 11.5–15.5)
WBC: 6.7 10*3/uL (ref 4.0–10.5)
WBC: 1.1 10*3/uL — AB (ref 4.0–10.5)

## 2013-04-12 LAB — POCT I-STAT 3, ART BLOOD GAS (G3+)
ACID-BASE DEFICIT: 9 mmol/L — AB (ref 0.0–2.0)
ACID-BASE DEFICIT: 11 mmol/L — AB (ref 0.0–2.0)
Acid-base deficit: 13 mmol/L — ABNORMAL HIGH (ref 0.0–2.0)
Acid-base deficit: 14 mmol/L — ABNORMAL HIGH (ref 0.0–2.0)
Acid-base deficit: 12 mmol/L — ABNORMAL HIGH (ref 0.0–2.0)
Acid-base deficit: 16 mmol/L — ABNORMAL HIGH (ref 0.0–2.0)
BICARBONATE: 16 meq/L — AB (ref 20.0–24.0)
BICARBONATE: 17.8 meq/L — AB (ref 20.0–24.0)
Bicarbonate: 17.1 mEq/L — ABNORMAL LOW (ref 20.0–24.0)
Bicarbonate: 20 mEq/L (ref 20.0–24.0)
Bicarbonate: 17.2 mEq/L — ABNORMAL LOW (ref 20.0–24.0)
Bicarbonate: 13.5 mEq/L — ABNORMAL LOW (ref 20.0–24.0)
O2 SAT: 100 %
O2 SAT: 87 %
O2 SAT: 97 %
O2 Saturation: 90 %
O2 Saturation: 90 %
O2 Saturation: 93 %
PCO2 ART: 38.9 mmHg (ref 35.0–45.0)
PCO2 ART: 74.5 mmHg — AB (ref 35.0–45.0)
PCO2 ART: 66.2 mmHg — AB (ref 35.0–45.0)
PCO2 ART: 59.4 mmHg — AB (ref 35.0–45.0)
PCO2 ART: 43.5 mmHg (ref 35.0–45.0)
PH ART: 7.092 — AB (ref 7.350–7.450)
PH ART: 7.083 — AB (ref 7.350–7.450)
PO2 ART: 87 mmHg (ref 80.0–100.0)
PO2 ART: 120 mmHg — AB (ref 80.0–100.0)
Patient temperature: 98.3
Patient temperature: 98.3
Patient temperature: 97.4
TCO2: 18 mmol/L (ref 0–100)
TCO2: 22 mmol/L (ref 0–100)
TCO2: 18 mmol/L (ref 0–100)
TCO2: 19 mmol/L (ref 0–100)
TCO2: 20 mmol/L (ref 0–100)
TCO2: 15 mmol/L (ref 0–100)
pCO2 arterial: 52.9 mmHg — ABNORMAL HIGH (ref 35.0–45.0)
pH, Arterial: 7.251 — ABNORMAL LOW (ref 7.350–7.450)
pH, Arterial: 7.041 — CL (ref 7.350–7.450)
pH, Arterial: 7.02 — CL (ref 7.350–7.450)
pH, Arterial: 7.095 — CL (ref 7.350–7.450)
pO2, Arterial: 67 mmHg — ABNORMAL LOW (ref 80.0–100.0)
pO2, Arterial: 432 mmHg — ABNORMAL HIGH (ref 80.0–100.0)
pO2, Arterial: 76 mmHg — ABNORMAL LOW (ref 80.0–100.0)
pO2, Arterial: 92 mmHg (ref 80.0–100.0)

## 2013-04-12 LAB — POCT I-STAT TROPONIN I: TROPONIN I, POC: 0.02 ng/mL (ref 0.00–0.08)

## 2013-04-12 LAB — BASIC METABOLIC PANEL
BUN: 51 mg/dL — ABNORMAL HIGH (ref 6–23)
BUN: 56 mg/dL — ABNORMAL HIGH (ref 6–23)
CHLORIDE: 109 meq/L (ref 96–112)
CO2: 16 mEq/L — ABNORMAL LOW (ref 19–32)
CO2: 16 mEq/L — ABNORMAL LOW (ref 19–32)
Calcium: 8.6 mg/dL (ref 8.4–10.5)
Calcium: 6.8 mg/dL — ABNORMAL LOW (ref 8.4–10.5)
Chloride: 112 mEq/L (ref 96–112)
Creatinine, Ser: 3.55 mg/dL — ABNORMAL HIGH (ref 0.50–1.35)
Creatinine, Ser: 4.29 mg/dL — ABNORMAL HIGH (ref 0.50–1.35)
GFR calc non Af Amer: 16 mL/min — ABNORMAL LOW (ref >90)
GFR calc non Af Amer: 13 mL/min — ABNORMAL LOW (ref >90)
GFR, EST AFRICAN AMERICAN: 18 mL/min — AB (ref >90)
GFR, EST AFRICAN AMERICAN: 15 mL/min — AB (ref >90)
GLUCOSE: 204 mg/dL — AB (ref 70–99)
Glucose, Bld: 140 mg/dL — ABNORMAL HIGH (ref 70–99)
POTASSIUM: 4.5 meq/L (ref 3.7–5.3)
Potassium: 5.3 mEq/L (ref 3.7–5.3)
SODIUM: 147 meq/L (ref 137–147)
SODIUM: 144 meq/L (ref 137–147)

## 2013-04-12 LAB — LACTIC ACID, PLASMA
Lactic Acid, Venous: 3.3 mmol/L — ABNORMAL HIGH (ref 0.5–2.2)
Lactic Acid, Venous: 3.1 mmol/L — ABNORMAL HIGH (ref 0.5–2.2)

## 2013-04-12 LAB — GLUCOSE, CAPILLARY
GLUCOSE-CAPILLARY: 202 mg/dL — AB (ref 70–99)
Glucose-Capillary: 157 mg/dL — ABNORMAL HIGH (ref 70–99)
Glucose-Capillary: 154 mg/dL — ABNORMAL HIGH (ref 70–99)

## 2013-04-12 LAB — CREATININE, URINE, RANDOM: CREATININE, URINE: 324.54 mg/dL

## 2013-04-12 LAB — MRSA PCR SCREENING: MRSA by PCR: POSITIVE — AB

## 2013-04-12 LAB — INFLUENZA PANEL BY PCR (TYPE A & B)
H1N1FLUPCR: NOT DETECTED
Influenza A By PCR: NEGATIVE
Influenza B By PCR: NEGATIVE

## 2013-04-12 LAB — PRO B NATRIURETIC PEPTIDE: PRO B NATRI PEPTIDE: 5757 pg/mL — AB (ref 0–125)

## 2013-04-12 LAB — CARBOXYHEMOGLOBIN
CARBOXYHEMOGLOBIN: 0.5 % (ref 0.5–1.5)
Methemoglobin: 0.8 % (ref 0.0–1.5)
O2 SAT: 88.8 %
Total hemoglobin: 8.2 g/dL — ABNORMAL LOW (ref 13.5–18.0)

## 2013-04-12 LAB — TRIGLYCERIDES: TRIGLYCERIDES: 145 mg/dL (ref <150)

## 2013-04-12 LAB — PROCALCITONIN: PROCALCITONIN: 56.18 ng/mL

## 2013-04-12 LAB — SODIUM, URINE, RANDOM: SODIUM UR: 23 meq/L

## 2013-04-12 MED ORDER — SODIUM CHLORIDE 0.9 % IV SOLN
3.0000 ug/kg/min | INTRAVENOUS | Status: DC
  Filled 2013-04-12: qty 20

## 2013-04-12 MED ORDER — NOREPINEPHRINE BITARTRATE 1 MG/ML IJ SOLN: 5.0000 ug/min | INTRAVENOUS | Status: DC

## 2013-04-12 MED ORDER — INSULIN ASPART 100 UNIT/ML ~~LOC~~ SOLN
0.0000 [IU] | SUBCUTANEOUS | Status: DC
  Administered 2013-04-12 (×2): 3 [IU] via SUBCUTANEOUS
  Administered 2013-04-12: 5 [IU] via SUBCUTANEOUS

## 2013-04-12 MED ORDER — ASPIRIN 81 MG PO TABS: 81.0000 mg | ORAL_TABLET | Freq: Every day | ORAL | Status: DC

## 2013-04-12 MED ORDER — ATORVASTATIN CALCIUM 80 MG PO TABS
80.0000 mg | ORAL_TABLET | Freq: Every day | ORAL | Status: DC
  Administered 2013-04-12: 80 mg
  Filled 2013-04-12: qty 1

## 2013-04-12 MED ORDER — STERILE WATER FOR INJECTION IV SOLN
INTRAVENOUS | Status: DC
  Filled 2013-04-12 (×2): qty 150

## 2013-04-12 MED ORDER — SODIUM CHLORIDE 0.9 % IV BOLUS (SEPSIS)
1000.0000 mL | INTRAVENOUS | Status: DC | PRN
  Administered 2013-04-12: 1000 mL via INTRAVENOUS

## 2013-04-12 MED ORDER — SUCCINYLCHOLINE CHLORIDE 20 MG/ML IJ SOLN
INTRAMUSCULAR | Status: AC
  Filled 2013-04-12: qty 1

## 2013-04-12 MED ORDER — AMIODARONE LOAD VIA INFUSION
150.0000 mg | Freq: Once | INTRAVENOUS | Status: AC
  Administered 2013-04-12: 150 mg via INTRAVENOUS
  Filled 2013-04-12: qty 83.34

## 2013-04-12 MED ORDER — SODIUM CHLORIDE 0.9 % IV BOLUS (SEPSIS)
500.0000 mL | Freq: Once | INTRAVENOUS | Status: AC
  Administered 2013-04-12: 500 mL via INTRAVENOUS

## 2013-04-12 MED ORDER — DEXTROSE 5 % IV SOLN
INTRAVENOUS | Status: DC
  Administered 2013-04-12 (×2): via INTRAVENOUS
  Filled 2013-04-12 (×4): qty 150

## 2013-04-12 MED ORDER — LEVOFLOXACIN IN D5W 750 MG/150ML IV SOLN
750.0000 mg | Freq: Once | INTRAVENOUS | Status: AC
  Administered 2013-04-12: 750 mg via INTRAVENOUS
  Filled 2013-04-12: qty 150

## 2013-04-12 MED ORDER — DEXTROSE 5 % IV SOLN
1.0000 g | Freq: Once | INTRAVENOUS | Status: AC
  Administered 2013-04-12: 1 g via INTRAVENOUS
  Filled 2013-04-12: qty 10

## 2013-04-12 MED ORDER — PHENYLEPHRINE HCL 10 MG/ML IJ SOLN
30.0000 ug/min | INTRAVENOUS | Status: DC
  Administered 2013-04-12: 200 ug/min via INTRAVENOUS
  Filled 2013-04-12: qty 1

## 2013-04-12 MED ORDER — BIOTENE DRY MOUTH MT LIQD
15.0000 mL | Freq: Four times a day (QID) | OROMUCOSAL | Status: DC
  Administered 2013-04-12 – 2013-04-13 (×3): 15 mL via OROMUCOSAL

## 2013-04-12 MED ORDER — NOREPINEPHRINE BITARTRATE 1 MG/ML IJ SOLN
2.0000 ug/min | INTRAVENOUS | Status: DC
  Administered 2013-04-12 – 2013-04-13 (×2): 50 ug/min via INTRAVENOUS
  Filled 2013-04-12 (×5): qty 16

## 2013-04-12 MED ORDER — HYDROCORTISONE NA SUCCINATE PF 100 MG IJ SOLR
50.0000 mg | Freq: Four times a day (QID) | INTRAMUSCULAR | Status: DC
  Administered 2013-04-12 – 2013-04-13 (×2): 50 mg via INTRAVENOUS
  Filled 2013-04-12 (×3): qty 1

## 2013-04-12 MED ORDER — FENTANYL CITRATE 0.05 MG/ML IJ SOLN
50.0000 ug | INTRAMUSCULAR | Status: DC | PRN
  Filled 2013-04-12: qty 2

## 2013-04-12 MED ORDER — SODIUM CHLORIDE 0.9 % IV BOLUS (SEPSIS)
1000.0000 mL | Freq: Once | INTRAVENOUS | Status: AC
  Administered 2013-04-12: 1000 mL via INTRAVENOUS

## 2013-04-12 MED ORDER — CHLORHEXIDINE GLUCONATE 0.12 % MT SOLN
15.0000 mL | Freq: Two times a day (BID) | OROMUCOSAL | Status: DC
  Administered 2013-04-12 (×2): 15 mL via OROMUCOSAL
  Filled 2013-04-12 (×4): qty 15

## 2013-04-12 MED ORDER — HEPARIN SODIUM (PORCINE) 1000 UNIT/ML DIALYSIS
1000.0000 [IU] | INTRAMUSCULAR | Status: DC | PRN
  Filled 2013-04-12: qty 6

## 2013-04-12 MED ORDER — CISATRACURIUM BOLUS VIA INFUSION
0.0500 mg/kg | Freq: Once | INTRAVENOUS | Status: DC
  Filled 2013-04-12: qty 4

## 2013-04-12 MED ORDER — ROCURONIUM BROMIDE 50 MG/5ML IV SOLN
INTRAVENOUS | Status: AC
  Administered 2013-04-12: 70 mg
  Filled 2013-04-12: qty 2

## 2013-04-12 MED ORDER — PANTOPRAZOLE SODIUM 40 MG PO PACK
40.0000 mg | PACK | Freq: Every day | ORAL | Status: DC
  Administered 2013-04-12: 40 mg
  Filled 2013-04-12: qty 20

## 2013-04-12 MED ORDER — DEXTROSE 5 % IV SOLN: 1.0000 g | INTRAVENOUS | Status: DC

## 2013-04-12 MED ORDER — PROPOFOL 10 MG/ML IV EMUL
0.0000 ug/kg/min | INTRAVENOUS | Status: DC
  Administered 2013-04-12: 4.889 ug/kg/min via INTRAVENOUS
  Filled 2013-04-12: qty 100

## 2013-04-12 MED ORDER — VASOPRESSIN 20 UNIT/ML IJ SOLN
0.0300 [IU]/min | INTRAVENOUS | Status: DC
  Administered 2013-04-12: 0.03 [IU]/min via INTRAVENOUS
  Filled 2013-04-12: qty 2.5

## 2013-04-12 MED ORDER — PRISMASOL BGK 4/2.5 32-4-2.5 MEQ/L IV SOLN
INTRAVENOUS | Status: DC
  Filled 2013-04-12 (×7): qty 5000

## 2013-04-12 MED ORDER — AZITHROMYCIN 500 MG IV SOLR
500.0000 mg | Freq: Once | INTRAVENOUS | Status: AC
  Administered 2013-04-12: 500 mg via INTRAVENOUS

## 2013-04-12 MED ORDER — SODIUM BICARBONATE 8.4 % IV SOLN
INTRAVENOUS | Status: DC
  Filled 2013-04-12 (×5): qty 150

## 2013-04-12 MED ORDER — FENTANYL CITRATE 0.05 MG/ML IJ SOLN
50.0000 ug | INTRAMUSCULAR | Status: DC | PRN
  Administered 2013-04-12 (×2): 50 ug via INTRAVENOUS
  Filled 2013-04-12 (×2): qty 2

## 2013-04-12 MED ORDER — BIOTENE DRY MOUTH MT LIQD
15.0000 mL | Freq: Two times a day (BID) | OROMUCOSAL | Status: DC
Start: 2013-04-13 — End: 2013-04-12

## 2013-04-12 MED ORDER — NOREPINEPHRINE BITARTRATE 1 MG/ML IJ SOLN
2.0000 ug/min | INTRAVENOUS | Status: DC
  Administered 2013-04-12: 50 ug/min via INTRAVENOUS
  Filled 2013-04-12: qty 4

## 2013-04-12 MED ORDER — DEXTROSE 5 % IV SOLN
500.0000 mg | INTRAVENOUS | Status: DC
  Administered 2013-04-12: 500 mg via INTRAVENOUS

## 2013-04-12 MED ORDER — ARTIFICIAL TEARS OP OINT: 1.0000 "application " | TOPICAL_OINTMENT | Freq: Three times a day (TID) | OPHTHALMIC | Status: DC

## 2013-04-12 MED ORDER — HEPARIN (PORCINE) 2000 UNITS/L FOR CRRT
INTRAVENOUS_CENTRAL | Status: DC | PRN
  Filled 2013-04-12: qty 1000

## 2013-04-12 MED ORDER — SODIUM BICARBONATE 8.4 % IV SOLN
INTRAVENOUS | Status: AC
  Administered 2013-04-12: 100 meq via INTRAVENOUS
  Filled 2013-04-12: qty 100

## 2013-04-12 MED ORDER — SODIUM BICARBONATE 8.4 % IV SOLN
50.0000 meq | Freq: Once | INTRAVENOUS | Status: AC
  Administered 2013-04-12: 50 meq via INTRAVENOUS

## 2013-04-12 MED ORDER — CHLORHEXIDINE GLUCONATE 0.12 % MT SOLN: 15.0000 mL | Freq: Two times a day (BID) | OROMUCOSAL | Status: DC

## 2013-04-12 MED ORDER — AMIODARONE HCL IN DEXTROSE 360-4.14 MG/200ML-% IV SOLN
INTRAVENOUS | Status: AC
  Administered 2013-04-12: 33.3 mL
  Filled 2013-04-12: qty 200

## 2013-04-12 MED ORDER — EPINEPHRINE HCL 1 MG/ML IJ SOLN
0.5000 ug/min | INTRAVENOUS | Status: DC
  Administered 2013-04-12: 1 ug/min via INTRAVENOUS
  Administered 2013-04-13: 20 ug/min via INTRAVENOUS
  Filled 2013-04-12 (×2): qty 4

## 2013-04-12 MED ORDER — NITROGLYCERIN 0.4 MG SL SUBL: 0.4000 mg | SUBLINGUAL_TABLET | SUBLINGUAL | Status: DC | PRN

## 2013-04-12 MED ORDER — AMIODARONE HCL IN DEXTROSE 360-4.14 MG/200ML-% IV SOLN
30.0000 mg/h | INTRAVENOUS | Status: DC
Start: 2013-04-12 — End: 2013-04-12
  Filled 2013-04-12: qty 200

## 2013-04-12 MED ORDER — SODIUM BICARBONATE 8.4 % IV SOLN: INTRAVENOUS | Status: DC

## 2013-04-12 MED ORDER — SODIUM CHLORIDE 0.9 % IV SOLN
1.0000 mg/h | INTRAVENOUS | Status: DC
  Administered 2013-04-12: 1 mg/h via INTRAVENOUS
  Filled 2013-04-12: qty 10

## 2013-04-12 MED ORDER — OSELTAMIVIR PHOSPHATE 6 MG/ML PO SUSR
75.0000 mg | Freq: Two times a day (BID) | ORAL | Status: DC
  Administered 2013-04-12 (×2): 75 mg
  Filled 2013-04-12 (×2): qty 12.5

## 2013-04-12 MED ORDER — MIDAZOLAM HCL 2 MG/2ML IJ SOLN
1.0000 mg | INTRAMUSCULAR | Status: DC | PRN
  Filled 2013-04-12: qty 2

## 2013-04-12 MED ORDER — IPRATROPIUM-ALBUTEROL 0.5-2.5 (3) MG/3ML IN SOLN
3.0000 mL | Freq: Once | RESPIRATORY_TRACT | Status: AC
  Administered 2013-04-12: 3 mL via RESPIRATORY_TRACT
  Filled 2013-04-12: qty 3

## 2013-04-12 MED ORDER — SODIUM CHLORIDE 0.9 % IV SOLN
INTRAVENOUS | Status: DC
  Administered 2013-04-12: 12:00:00 via INTRAVENOUS

## 2013-04-12 MED ORDER — VASOPRESSIN 20 UNIT/ML IJ SOLN: 0.0300 [IU]/min | INTRAVENOUS | Status: DC

## 2013-04-12 MED ORDER — DEXTROSE 5 % IV SOLN: 500.0000 mg | INTRAVENOUS | Status: DC

## 2013-04-12 MED ORDER — MIDAZOLAM HCL 2 MG/2ML IJ SOLN
4.0000 mg | INTRAMUSCULAR | Status: DC | PRN
  Administered 2013-04-12: 4 mg via INTRAVENOUS
  Filled 2013-04-12: qty 4

## 2013-04-12 MED ORDER — LEVOFLOXACIN IN D5W 500 MG/100ML IV SOLN: 500.0000 mg | INTRAVENOUS | Status: DC

## 2013-04-12 MED ORDER — IPRATROPIUM-ALBUTEROL 0.5-2.5 (3) MG/3ML IN SOLN: 3.0000 mL | RESPIRATORY_TRACT | Status: DC | PRN

## 2013-04-12 MED ORDER — VANCOMYCIN HCL 10 G IV SOLR
1500.0000 mg | Freq: Once | INTRAVENOUS | Status: AC
  Administered 2013-04-12: 1500 mg via INTRAVENOUS
  Filled 2013-04-12: qty 1500

## 2013-04-12 MED ORDER — AMIODARONE HCL IN DEXTROSE 360-4.14 MG/200ML-% IV SOLN
60.0000 mg/h | INTRAVENOUS | Status: AC
  Filled 2013-04-12: qty 200

## 2013-04-12 MED ORDER — DILTIAZEM HCL 100 MG IV SOLR
5.0000 mg/h | INTRAVENOUS | Status: DC
  Filled 2013-04-12: qty 100

## 2013-04-12 MED ORDER — SODIUM BICARBONATE 8.4 % IV SOLN
INTRAVENOUS | Status: DC
  Administered 2013-04-12: 15:00:00 via INTRAVENOUS
  Filled 2013-04-12 (×2): qty 150

## 2013-04-12 MED ORDER — ETOMIDATE 2 MG/ML IV SOLN
INTRAVENOUS | Status: AC
  Administered 2013-04-12: 20 mg
  Filled 2013-04-12: qty 20

## 2013-04-12 MED ORDER — SODIUM BICARBONATE 8.4 % IV SOLN
INTRAVENOUS | Status: AC
  Administered 2013-04-12: 100 meq
  Filled 2013-04-12: qty 100

## 2013-04-12 MED ORDER — DEXTROSE 5 % IV SOLN
30.0000 ug/min | INTRAVENOUS | Status: DC
  Administered 2013-04-12 – 2013-04-13 (×3): 200 ug/min via INTRAVENOUS
  Filled 2013-04-12 (×3): qty 4

## 2013-04-12 MED ORDER — DEXTROSE 5 % IV SOLN
2.0000 g | Freq: Once | INTRAVENOUS | Status: AC
  Administered 2013-04-12: 2 g via INTRAVENOUS
  Filled 2013-04-12: qty 2

## 2013-04-12 MED ORDER — HEPARIN SODIUM (PORCINE) 5000 UNIT/ML IJ SOLN: 5000.0000 [IU] | Freq: Three times a day (TID) | INTRAMUSCULAR | Status: DC

## 2013-04-12 MED ORDER — MIDAZOLAM HCL 2 MG/2ML IJ SOLN: 1.0000 mg | INTRAMUSCULAR | Status: DC | PRN

## 2013-04-12 MED ORDER — LORAZEPAM 2 MG/ML IJ SOLN
0.5000 mg | Freq: Once | INTRAMUSCULAR | Status: AC
  Administered 2013-04-12: 0.5 mg via INTRAVENOUS
  Filled 2013-04-12: qty 1

## 2013-04-12 MED ORDER — HYDROCORTISONE SOD SUCCINATE 100 MG PF FOR IT USE
50.0000 mg | Freq: Four times a day (QID) | INTRAMUSCULAR | Status: DC
  Filled 2013-04-12 (×3): qty 0.5

## 2013-04-12 MED ORDER — VANCOMYCIN HCL IN DEXTROSE 750-5 MG/150ML-% IV SOLN
750.0000 mg | INTRAVENOUS | Status: DC
  Filled 2013-04-12: qty 150

## 2013-04-12 MED ORDER — LIDOCAINE HCL (CARDIAC) 20 MG/ML IV SOLN
INTRAVENOUS | Status: AC
  Filled 2013-04-12: qty 5

## 2013-04-12 MED ORDER — SODIUM BICARBONATE 8.4 % IV SOLN
100.0000 meq | INTRAVENOUS | Status: AC
  Administered 2013-04-12: 100 meq via INTRAVENOUS
  Filled 2013-04-12: qty 100

## 2013-04-12 NOTE — Procedures (Signed)
PROCEDURE:  Radial artery line placement. (A-line)  INDICATION: monitoring hemodynamics  PROCEDURE OPERATOR: Darden PalmerSamaya Qureshi, MD and Rory Percyaniel Feinstein, MD  CONSENT: obtained by RN from wife  PROCEDURE SUMMARY:  The patient was prepped and draped in the usual sterile manner using chlorhexidine scrub. 1% lidocaine was used to numb the region. The <RIGHT> radial artery was palpated and visualized by ultrasound and successfully cannulated. Pulsatile, arterial blood was visualized and the artery was then unsuccessfully threaded using the Seldinger technique.  Will need to try femoral approach.   ESTIMATED BLOOD LOSS: 2-4cc  Signed: Darden PalmerSamaya Qureshi, MD PGY-2, Internal Medicine Resident Pager: [phone]  04/28/2013,4:15 PM  Failed after multiple attempts in vessell and wire Aborted  Mcarthur Rossettianiel J. Tyson AliasFeinstein, MD, FACP Pgr: [phone] [facility]

## 2013-04-12 NOTE — Progress Notes (Signed)
  Echocardiogram 2D Echocardiogram has been performed.  Carl Gomez, Carl Gomez 04/11/2013, 5:00 PM

## 2013-04-12 NOTE — ED Notes (Signed)
pcxray done for tube placement

## 2013-04-12 NOTE — ED Provider Notes (Signed)
CSN: [id number]     Arrival date & time 04/27/2013  0930 History   First MD Initiated Contact with Patient 04/19/2013 [phone]     Chief Complaint  Patient presents with  . Chest Pain  . Shortness of Breath   (Consider location/radiation/quality/duration/timing/severity/associated sxs/prior Treatment) Patient is a 73 y.o. male presenting with shortness of breath. The history is provided by the patient and the EMS personnel.  Shortness of Breath Severity:  Severe Onset quality:  Gradual Duration:  2 days Timing:  Constant Progression:  Worsening Chronicity:  New Context: not activity   Relieved by:  Nothing Worsened by:  Nothing tried Ineffective treatments:  None tried Associated symptoms: cough   Associated symptoms: no abdominal pain, no chest pain, no fever, no headaches, no hemoptysis, no PND, no rash, no sputum production, no syncope and no vomiting   Risk factors: tobacco use   Risk factors: no hx of cancer, no hx of PE/DVT and no prolonged immobilization     73 year old male with a chief complaint of chest pain and shortness of breath. Patient states this going on for the past 2-3 days. Patient has been having to sit up in a chair at home due to the shortness of breath. Patient with some cough over the past couple days. Patient denies any fever or chills. Patient with right-sided chest wall tenderness. Patient went to [facility] primary care today was found to be short of breath hypoxic tachypneic pale and diaphoretic. Patient was started on CPAP and was transferred to our facility after nitroglycerin and aspirin. Patient's mental status much improved en route. Patient complaining of right-sided chest tenderness continued shortness of breath. Patient feels that he feels much better.   Past Medical History  Diagnosis Date  . AAA (abdominal aortic aneurysm)     status post aortobifemoral bypass grafting by Dr. Leonette Most fields  . Hyperlipidemia   . Stroke   . Renal artery stenosis   .  Anorexia   . Fatigue   . Prostatitis   . ED (erectile dysfunction)   . Radicular low back pain   . Cerebral atherosclerosis 12/12/2011    Carotid Duplex - Left Buld/Proximal ICA-moderate amount fibrous plaque, 50-69% reduction  . Bruit 11/23/2010    Asymptomatic, Left Bulb/Proximal ICA - Severe fibrous plaque, less than or equal to 70% reduction  . CAD (coronary artery disease)     with PTCA  in 1991  . CHF (congestive heart failure) 11/18/2007    Lexiscan - EF 71%, perfusion defect in the inferior myocardial region is consistent with diaphragmatic attenuation, remaining myocardium demonstrates normal perfusion, no evidence of ischemia or infarct. No ECG changes, EKG negative for ischemia  . Hypertension 11/18/2007    2D Echo - EF >55%, no significant valvular pathology  . Thoracic aortic aneurysm     repaired by Dr. Melrose Nakayama remotely  . Tobacco abuse    Past Surgical History  Procedure Laterality Date  . Abdominal aortic aneurysm repair  Oct. 6, 2009  . Pr vein bypass graft,aorto-fem-pop  Oct. 6, 2009    Bi-common iliac Artery BPG  . Thoracic aortic aneurysm repair  2001    by Dr. Laneta Simmers  . 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 Cancer Father     Lung  . Cancer Brother     Prostate  .  Heart disease Brother   . Hyperlipidemia Brother   . Hypertension Brother   . Cancer Brother     Prostate  . Hypertension Daughter   . Heart attack Daughter    History  Substance Use Topics  . Smoking status: Current Every Day Smoker -- 1.00 packs/day for 60 years    Types: Cigarettes  . Smokeless tobacco: Never Used  . Alcohol Use: Yes     Comment: occ    Review of Systems  Constitutional: Negative for fever and chills.  HENT: Negative for congestion and facial swelling.   Eyes: Negative for  discharge and visual disturbance.  Respiratory: Positive for cough and shortness of breath. Negative for hemoptysis, sputum production and choking.   Cardiovascular: Negative for chest pain, palpitations, syncope and PND.  Gastrointestinal: Negative for vomiting, abdominal pain and diarrhea.  Musculoskeletal: Negative for arthralgias and myalgias.  Skin: Negative for color change and rash.  Neurological: Negative for tremors, syncope and headaches.  Psychiatric/Behavioral: Negative for confusion and dysphoric mood.    Allergies  Codeine  Home Medications   No current outpatient prescriptions on file. BP 90/55  Pulse 121  Temp(Src) 99.7 F (37.6 C) (Rectal)  Resp 31  Ht 6' (1.829 m)  Wt 165 lb 5.5 oz (75 kg)  BMI 22.42 kg/m2  SpO2 87% Physical Exam  Nursing note and vitals reviewed. Constitutional: He is oriented to person, place, and time. He appears well-developed and well-nourished.  HENT:  Head: Normocephalic and atraumatic.  Eyes: EOM are normal. Pupils are equal, round, and reactive to light.  Neck: Normal range of motion. Neck supple. No JVD present.  Cardiovascular: Normal rate and regular rhythm.  Exam reveals no gallop and no friction rub.   No murmur heard. No noted JVD. No S3.   Pulmonary/Chest: He is in respiratory distress. He has no wheezes. He has rales (from the bases of the clavicles). He exhibits no tenderness.  Abdominal: He exhibits no distension. There is no rebound and no guarding.  Musculoskeletal: Normal range of motion. He exhibits no edema.  Neurological: He is alert and oriented to person, place, and time.  Skin: No rash noted. No pallor.  Psychiatric: He has a normal mood and affect. His behavior is normal.    ED Course  Procedures (including critical care time) Labs Review Labs Reviewed  MRSA PCR SCREENING - Abnormal; Notable for the following:    MRSA by PCR POSITIVE (*)    All other components within normal limits  PRO B NATRIURETIC  PEPTIDE - Abnormal; Notable for the following:    Pro B Natriuretic peptide (BNP) 5757.0 (*)    All other components within normal limits  CBC WITH DIFFERENTIAL - Abnormal; Notable for the following:    Platelets 107 (*)    Lymphocytes Relative 11 (*)    Monocytes Relative 17 (*)    Monocytes Absolute 1.1 (*)    All other components within normal limits  BASIC METABOLIC PANEL - Abnormal; Notable for the following:    CO2 16 (*)    Glucose, Bld 140 (*)    BUN 51 (*)    Creatinine, Ser 3.55 (*)    GFR calc non Af Amer 16 (*)    GFR calc Af Amer 18 (*)    All other components within normal limits  LACTIC ACID, PLASMA - Abnormal; Notable for the following:    Lactic Acid, Venous 3.3 (*)    All other components within normal limits  GLUCOSE, CAPILLARY -  Abnormal; Notable for the following:    Glucose-Capillary 157 (*)    All other components within normal limits  GLUCOSE, CAPILLARY - Abnormal; Notable for the following:    Glucose-Capillary 154 (*)    All other components within normal limits  POCT I-STAT 3, BLOOD GAS (G3+) - Abnormal; Notable for the following:    pH, Arterial 7.251 (*)    pO2, Arterial 67.0 (*)    Bicarbonate 17.1 (*)    Acid-base deficit 9.0 (*)    All other components within normal limits  POCT I-STAT 3, BLOOD GAS (G3+) - Abnormal; Notable for the following:    pH, Arterial 7.041 (*)    pCO2 arterial 74.5 (*)    pO2, Arterial 432.0 (*)    Acid-base deficit 11.0 (*)    All other components within normal limits  POCT I-STAT 3, BLOOD GAS (G3+) - Abnormal; Notable for the following:    pH, Arterial 7.092 (*)    pCO2 arterial 52.9 (*)    pO2, Arterial 76.0 (*)    Bicarbonate 16.0 (*)    Acid-base deficit 13.0 (*)    All other components within normal limits  CULTURE, BLOOD (ROUTINE X 2)  CULTURE, BLOOD (ROUTINE X 2)  CULTURE, RESPIRATORY (NON-EXPECTORATED)  RESPIRATORY VIRUS PANEL  TRIGLYCERIDES  PROCALCITONIN  INFLUENZA PANEL BY PCR (TYPE A & B, H1N1)   SODIUM, URINE, RANDOM  CREATININE, URINE, RANDOM  BLOOD GAS, ARTERIAL  BLOOD GAS, ARTERIAL  BLOOD GAS, ARTERIAL  CORTISOL  PROCALCITONIN  BASIC METABOLIC PANEL  CBC  POCT I-STAT TROPONIN I   Imaging Review Dg Chest Portable 1 View  05/01/2013   CLINICAL DATA:  Post intubation  EXAM: PORTABLE CHEST - 1 VIEW  COMPARISON:  Prior film same day  FINDINGS: Cardiomediastinal silhouette is stable. Persistent consolidation in right upper lobe. Endotracheal tube in place with tip 3.3 cm above the carina. No diagnostic pneumothorax.  IMPRESSION: Endotracheal tube in place. Persistent consolidation in right upper lobe.   Electronically Signed   By: Natasha Mead M.D.   On: 04/24/2013 10:54   Dg Chest Portable 1 View  04/07/2013   CLINICAL DATA:  Chest pain  EXAM: PORTABLE CHEST - 1 VIEW  COMPARISON:  12/10/2007  FINDINGS: Cardiomediastinal silhouette is stable. There is dense infiltrate/consolidation in right upper lobe. Follow-up to resolution is recommended. No pulmonary edema.  IMPRESSION: Infiltrate/ consolidation in right upper lobe. Follow-up to resolution after treatment is recommended.   Electronically Signed   By: Natasha Mead M.D.   On: 04/22/2013 10:30   Dg Abd Portable 1v  04/09/2013   CLINICAL DATA:  NG tube placement.  EXAM: PORTABLE ABDOMEN - 1 VIEW  COMPARISON:  CT scan 02/18/2013.  FINDINGS: The NG tube tip is in the fundal region of the stomach. Moderate stool noted in the colon. No definite free air.  IMPRESSION: NG tube tip is in the fundal region of the stomach.   Electronically Signed   By: Loralie Champagne M.D.   On: 04/18/2013 15:27      MDM   1. Respiratory failure   2. CAP (community acquired pneumonia)   3. Acute kidney injury   4. Acute respiratory failure   5. Metabolic acidosis   6. Pneumonia   7. Severe sepsis   8. Tobacco abuse      73 year old male with a chief complaint of chest pain and shortness of breath. Concern for her CHF exacerbation due to the story. Not  hypertensive on arrival appears well  on initial exam. We'll check ABG CBC troponin BNP.   Called to the bedside due to patient's blood pressure being in the 70 systolic. Patient increasing tachypnea feels that he is having worsening trouble breathing. Patient feels that the mask is uncomfortable. We'll try a trial without the mask.  We'll give a small dose of Ativan.  Patient reassessed with worsening mental status and increased work of breathing decision made to intubate the patient. Right middle lobe pneumonia noted on chest x-ray patient started on ceftriaxone azithromycin.  Critical-care consult for admission  Melene Plan, MD 2013-05-02 [phone]

## 2013-04-12 NOTE — Progress Notes (Signed)
Critical ABG results called to box. MD ordered to increase VT from 7cc to 8cc.   Ancil Boozerarrie Kessler Kopinski, RRT, RCP

## 2013-04-12 NOTE — Progress Notes (Signed)
Pt received from ED with soft BP and decreased O2 saturation while intubated. Abnormal ABG. Refractory shock Continues shock and acidosis BP 83/46  Pulse 121  Temp(Src) 99.7 F (37.6 C) (Rectal)  Resp 28  Ht 6' (1.829 m)  Wt 165 lb 5.5 oz (75 kg)  BMI 22.42 kg/m2  SpO2 87%   General: rass deep Neuro: deep rass -4 HEENT: low jvd PULM: coarse bilat rt greater left CV: s1 s2 RRR distant GI: soft, BS wnl reb g Extremities: no edema   Recent Labs Lab 04/09/2013 1022 04/10/2013 1310  PHART 7.251* 7.041*  PCO2ART 38.9 74.5*  PO2ART 67.0* 432.0*  HCO3 17.1* 20.0  TCO2 18 22  O2SAT 90.0 100.0   A/P: Severe respiratory acidosis, in the setting of now septic shock, ARF, ARF, MODS - perform A-line and LIJ placement, with pressor ordered as needed.  - ARDS protocol as LEft worsening after ph improves slight, then bicarb for ARDS ph less 7.24 -place line add septic shock protocol, cvp -increase MV, rate on vent -follow out, bmet q4h , may need cvvhd, may need -some permissive hypercapnia -add vaso -repeat pcxr -update family -repeat lactic for trend  -to cvp goa 12, VOLUME -svo2  X 1, until echo findings known (h/o chf) worsening status, change ceftriaxone to ceftaz -change azithro to levofloxacin -repeat cbc with such profound shock -pcr noted -continue vanc -continue dose adjusted tamiflu, consider double dosing, pending renal recovery -renal US -echo  Kuneff, Renee DO PGY-2   Ccm time 45 min   I have fully examined this patient and agree with above findings.    And edited in full  Mcarthur RossettiDaniel J. Tyson AliasFeinstein, MD, FACP Pgr: [phone] [facility]

## 2013-04-12 NOTE — Procedures (Signed)
Central Venous hemodialysis Catheter Insertion Procedure Note Frederik PearRobert L Foot [id number] 08/10/1940  Procedure: Insertion of Central Venous Catheter Indications: CRRT  Procedure Details Consent: Risks of procedure as well as the alternatives and risks of each were explained to the (patient/caregiver).  Consent for procedure obtained. Time Out: Verified patient identification, verified procedure, site/side was marked, verified correct patient position, special equipment/implants available, medications/allergies/relevent history reviewed, required imaging and test results available.  Performed  Maximum sterile technique was used including antiseptics, cap, gloves, gown, hand hygiene, mask and sheet. Skin prep: Chlorhexidine; local anesthetic administered A antimicrobial bonded/coated triple lumen catheter was placed in the right internal jugular vein using the Seldinger technique.  Evaluation Blood flow good Complications: No apparent complications Patient did tolerate procedure well. Chest X-ray ordered to verify placement.  CXR: pending.  BABCOCK,PETE 04/24/2013, 9:31 PM  eLink MD: I discussed the procedure and plans with P. Babcock.   Levy Pupaobert Amauri Medellin, MD, PhD 04/21/2013, 1:32 PM [place] [place] [facility] [phone] or if no answer [phone]

## 2013-04-12 NOTE — ED Notes (Signed)
20 amidate and 70 of  Rocuronium  Iv and  Pt intubated w/ 7.5 ,23 at the lip

## 2013-04-12 NOTE — Progress Notes (Signed)
Critical ABG results called to box. No vent changes at this time.

## 2013-04-12 NOTE — Progress Notes (Addendum)
Name: Carl Gomez MRN: [id number] DOB: 12-13-1940     ADMISSION DATE:  13-Apr-2013   REFERRING MD : EDP PRIMARY SERVICE: PCCM  CHIEF COMPLAINT:  SOB  BRIEF PATIENT DESCRIPTION:  73 yo vasculopath with 4 days of increasing orthopnea , dyspnea and hypoxia who presented to PCP with hypoxia. Cxr with large pna and intubated in ED by EDP.  SIGNIFICANT EVENTS / STUDIES:  2/9 intubated for resp failure.  LINES / TUBES: 2/9 OTT>>  CULTURES: 2/9 viral>> 2/9 sputum>> 2/9 bc x 2>> 2/9 uc>>  ANTIBIOTICS: 2/9 vanc>> 2/9 roc>>2/9 2/9 fortaz>>> 2/9 zithro>>2/9 levaquin 2/9>>> 2/9 tamiflu>>  SUBJECTIVE:  Refractory shock   VITAL SIGNS: Temp:  [96.5 F (35.8 C)-100.5 F (38.1 C)] 96.5 F (35.8 C) (02/09 1915) Pulse Rate:  [29-145] 107 (02/09 1954) Resp:  [16-35] 35 (02/09 1954) BP: (63-128)/(24-66) 79/36 mmHg (02/09 1954) SpO2:  [75 %-100 %] 90 % (02/09 1954) Arterial Line BP: (62-111)/(33-44) 111/39 mmHg (02/09 1915) FiO2 (%):  [50 %-100 %] 70 % (02/09 1954) Weight:  [75 kg (165 lb 5.5 oz)] 75 kg (165 lb 5.5 oz) (02/09 1130) HEMODYNAMICS: CVP:  [10 mmHg-12 mmHg] 11 mmHg VENTILATOR SETTINGS: Vent Mode:  [-] PRVC FiO2 (%):  [50 %-100 %] 70 % Set Rate:  [16 bmp-35 bmp] 35 bmp Vt Set:  [620 mL] 620 mL PEEP:  [5 cmH20-10 cmH20] 10 cmH20 Plateau Pressure:  [23 cmH20-34 cmH20] 34 cmH20 INTAKE / OUTPUT: Intake/Output     02/09 0701 - 02/10 0700   I.V. (mL/kg) 215 (2.9)   IV Piggyback 2000   Total Intake(mL/kg) 2215 (29.5)   Net +2215         PHYSICAL EXAMINATION: General:  Sedated and with NMB on vent Neuro:  As above HEENT:  Pupils pinpoint, Cardiovascular:  HSR RR Lungs:  Coarse rhonchi  With bloody secretions  Abdomen: Soft with faint bs Musculoskeletal: Intact Skin:  Warm, decreased pulses in lower ext  LABS:  CBC  Recent Labs Lab April 13, 2013 1005 13-Apr-2013 1722  WBC 6.7 1.1*  HGB 13.9 11.4*  HCT 41.0 34.9*  PLT 107* 108*   Coag's No results  found for this basename: APTT, INR,  in the last 168 hours BMET  Recent Labs Lab 04/06/2013 1005 04/30/2013 1722  NA 147 144  K 5.3 4.5  CL 109 112  CO2 16* 16*  BUN 51* 56*  CREATININE 3.55* 4.29*  GLUCOSE 140* 204*   Electrolytes  Recent Labs Lab 04/28/2013 1005 04/16/2013 1722  CALCIUM 8.6 6.8*   Sepsis Markers  Recent Labs Lab 04/05/2013 1315 04/18/2013 1800  LATICACIDVEN 3.3* 3.1*  PROCALCITON 56.18  --    ABG  Recent Labs Lab 04/07/2013 1445 13-Apr-2013 1803 04/24/2013 1855  PHART 7.092* 7.020* 7.083*  PCO2ART 52.9* 66.2* 59.4*  PO2ART 76.0* 87.0 92.0   Liver Enzymes No results found for this basename: AST, ALT, ALKPHOS, BILITOT, ALBUMIN,  in the last 168 hours Cardiac Enzymes  Recent Labs Lab 04/20/2013 1008  PROBNP 5757.0*   Glucose  Recent Labs Lab 04/15/2013 1250 04/20/2013 1634 04/23/2013 1845  GLUCAP 157* 154* 202*    Imaging Dg Chest Port 1 View  04/06/2013   CLINICAL DATA:  Central line placement  EXAM: PORTABLE CHEST - 1 VIEW  COMPARISON:  April 13, 2013 10:41 a.m.  FINDINGS: The heart size and mediastinal contours are stable. Endotracheal tube is identified distal tip 5.5 cm from carina. Left jugular central venous line is identified distal tip in the superior  vena cava. There is no pneumothorax. Consolidation of the right upper lobe is slightly worse. Consolidation the left mid lung is worse compared prior exam. The visualized skeletal structures are stable.  IMPRESSION: Left jugular central venous line distal tip in superior vena cava. There is no pneumothorax. Consolidation of bilateral lungs worse compared to prior exam.   Electronically Signed   By: Sherian ReinWei-Chen  Lin M.D.   On: 04/29/2013 19:51   Dg Chest Portable 1 View  04/07/2013   CLINICAL DATA:  Post intubation  EXAM: PORTABLE CHEST - 1 VIEW  COMPARISON:  Prior film same day  FINDINGS: Cardiomediastinal silhouette is stable. Persistent consolidation in right upper lobe. Endotracheal tube in place with tip  3.3 cm above the carina. No diagnostic pneumothorax.  IMPRESSION: Endotracheal tube in place. Persistent consolidation in right upper lobe.   Electronically Signed   By: Natasha MeadLiviu  Pop M.D.   On: 04/05/2013 10:54   Dg Chest Portable 1 View  04/24/2013   CLINICAL DATA:  Chest pain  EXAM: PORTABLE CHEST - 1 VIEW  COMPARISON:  12/10/2007  FINDINGS: Cardiomediastinal silhouette is stable. There is dense infiltrate/consolidation in right upper lobe. Follow-up to resolution is recommended. No pulmonary edema.  IMPRESSION: Infiltrate/ consolidation in right upper lobe. Follow-up to resolution after treatment is recommended.   Electronically Signed   By: Natasha MeadLiviu  Pop M.D.   On: 04/08/2013 10:30   Dg Abd Portable 1v  04/23/2013   CLINICAL DATA:  NG tube placement.  EXAM: PORTABLE ABDOMEN - 1 VIEW  COMPARISON:  CT scan 02/18/2013.  FINDINGS: The NG tube tip is in the fundal region of the stomach. Moderate stool noted in the colon. No definite free air.  IMPRESSION: NG tube tip is in the fundal region of the stomach.   Electronically Signed   By: Loralie ChampagneMark  Gallerani M.D.   On: 04/23/2013 15:27     CXR:  see above  ASSESSMENT / PLAN:  PULMONARY A: Acute hypoxic resp failure with Rt U Lobe and evolving LUL pna     COPD with continued tobacco abuse     Mix of resp/metabolic acidosis  P:   Vent bundle/ Ve maxed out/ avoid air trapping  ABX Pan culture BD's   CARDIOVASCULAR A:  CAD (last 2 d 11/2007 ef 50%) PVD Septic shock/MODS  P:  cvp goal 8-12 Cont pressors Cont stress dose steroids  CRRT to assist w/ acid base   RENAL  A:  Acute renal failure      Refractory metabolic acidosis  P:   Renal US  Place HD cath Nephro consult   GASTROINTESTINAL A:  GI Protection P:   PPI  HEMATOLOGIC A:  Dilutional anemia  P:  DVT propholaxis  INFECTIOUS A: CAP -->likely bacterial superinfection  P:   See flows .  Cover for viral till panels back.  ENDOCRINE A: Hyperglycemia  P:   ssi    NEUROLOGIC A: Sedated on vent. Agitated when not sedated but intact. P:   Sedated to rass-1 as needed for vent tolerance.  TODAY'S SUMMARY:  73 yo vasculopath with 4 days of increasing orthopnea , dyspnea and hypoxia who presented to PCP with hypoxia. Cxr with large pna and intubated in ED by EDP.    Anders SimmondsPete Babcock ACNP-BC [facility] Pager # [phone] OR # [phone] if no answer  Now in refractory shock on max dose levophed and vasopressin. Ve maximized. Acidosis is a mix of respiratory and metabolic. BP responding to bicarb boluses. Little else  to offer here. Will re-eval vent to assure no auto-peep effect, and have asked for nephrology consult for CRRT. Concerned that he will not survive even w/ escalation of care. Family is in agreement with current plan. They are also in agreement with DNR status. We will cont current aggressive efforts but if he has cardiac arrest in spite of this we will not provide CPR or other ACLS measures. His BP dropped to the 50's - CVVH will likely hasten his demise, defer until BP improves  Care during the described time interval was provided by me and/or other providers on the critical care team.  I have reviewed this patient's available data, including medical history, events of note, physical examination and test results as part of my evaluation  CC time x 40 m  Laron Angelini V.

## 2013-04-12 NOTE — Progress Notes (Signed)
ANTIBIOTIC CONSULT NOTE - INITIAL  Pharmacy Consult for vancomycin Indication: pneumonia  Allergies  Allergen Reactions  . Codeine     Patient Measurements: Height: 6' (182.9 cm) Weight: 165 lb 5.5 oz (75 kg) IBW/kg (Calculated) : 77.6  Vital Signs: Temp: 100.5 F (38.1 C) (02/09 1057) Temp src: Rectal (02/09 1057) BP: 120/41 mmHg (02/09 1130) Pulse Rate: 129 (02/09 1130) Intake/Output from previous day:   Intake/Output from this shift:    Labs:  Recent Labs  04/19/2013 1005  WBC 6.7  HGB 13.9  PLT PENDING  CREATININE 3.55*   Estimated Creatinine Clearance: 20 ml/min (by C-G formula based on Cr of 3.55). No results found for this basename: VANCOTROUGH, VANCOPEAK, VANCORANDOM, GENTTROUGH, GENTPEAK, GENTRANDOM, TOBRATROUGH, TOBRAPEAK, TOBRARND, AMIKACINPEAK, AMIKACINTROU, AMIKACIN,  in the last 72 hours   Microbiology: No results found for this or any previous visit (from the past 720 hour(s)).  Medical History: Past Medical History  Diagnosis Date  . AAA (abdominal aortic aneurysm)     status post aortobifemoral bypass grafting by Dr. Leonette Mostharles fields  . Hyperlipidemia   . Stroke   . Renal artery stenosis   . Anorexia   . Fatigue   . Prostatitis   . ED (erectile dysfunction)   . Radicular low back pain   . Cerebral atherosclerosis 12/12/2011    Carotid Duplex - Left Buld/Proximal ICA-moderate amount fibrous plaque, 50-69% reduction  . Bruit 11/23/2010    Asymptomatic, Left Bulb/Proximal ICA - Severe fibrous plaque, less than or equal to 70% reduction  . CAD (coronary artery disease)     with PTCA  in 1991  . CHF (congestive heart failure) 11/18/2007    Lexiscan - EF 71%, perfusion defect in the inferior myocardial region is consistent with diaphragmatic attenuation, remaining myocardium demonstrates normal perfusion, no evidence of ischemia or infarct. No ECG changes, EKG negative for ischemia  . Hypertension 11/18/2007    2D Echo - EF >55%, no significant  valvular pathology  . Thoracic aortic aneurysm     repaired by Dr. Melrose NakayamaBrian Bartel remotely  . Tobacco abuse     Assessment: 72 YOM with 4 days of increasing orthopnea, dyspnea and hypoxia- intubated in the ED. CXR with large PNA, to start vancomycin for possible staph aureus infection along with ceftriaxone and azithromycin per MD. No noted dialysis requirements, and [facility]. SCr 3.6mg /dL- unsure of baseline, CrCl ~4620mL/min. WBC 6.7, Tmax 100.5. Full cultures sent.  Goal of Therapy:  Vancomycin trough level 15-20 mcg/ml  Plan:  1. Vancomycin 1500mg  IV x1 as loading dose 2. Vancomycin 750mg  IV q24h starting tomorrow 3. Follow c/s, renal function, ability to de-escalate, LOT, [facility]  Elyon Zoll D. Greer Wainright, PharmD, BCPS Clinical Pharmacist Pager: [phone] 04/16/2013 12:06 PM

## 2013-04-12 NOTE — Progress Notes (Deleted)
RN called to come look at patient to schedule treatments, however, patient was sitting up eating dinner. He stated he felt great. Sats were 95%. Breath sounds were clear, diminished. Kept patient at PRN treatments, RN aware.  Nitasha Jewel, RRT, RCP 

## 2013-04-12 NOTE — ED Notes (Signed)
Dr Patria Manecampos in pt was on bipap per resp on arrival pt was agitated and  Wanted bipap removed which dr Patria Manecampos did placed on 4 lnc  resp drew gas

## 2013-04-12 NOTE — H&P (Signed)
Name: Carl PearRobert L Gomez MRN: [id number] DOB: 04/03/1940    ADMISSION DATE:  04/11/2013   REFERRING MD : EDP PRIMARY SERVICE: PCCM  CHIEF COMPLAINT:  SOB  BRIEF PATIENT DESCRIPTION:  73 yo vasculopath with 4 days of increasing orthopnea , dyspnea and hypoxia who presented to PCP with hypoxia. Cxr with large pna and intubated in ED by EDP.  SIGNIFICANT EVENTS / STUDIES:  2/9 intubated for resp failure.  LINES / TUBES: 2/9 OTT>>  CULTURES: 2/9 viral>> 2/9 sputum>> 2/9 bc x 2>> 2/9 uc>>  ANTIBIOTICS: 2/9 vanc>> 2/9 roc>> 2/9 zithro>> 2/9 tamiflu>>  HISTORY OF PRESENT ILLNESS:  73 yo vasculopath with 4 days of increasing orthopnea , dyspnea and hypoxia who presented to PCP with hypoxia. Cxr with large pna and intubated in ED by EDP.  Hid c x r reveals large dense rul pna. He has bloody secretions but was just intubated. He was noted to have renal failure. He has a hx oof vascular dz with  AAA, Cartoid dz, aortoiliac bpg 2009 per Dr. Darrick PennaFields.   Family reports that he had a "virus" for about one week, and was improving until he suddenly became worse over the past two days.  His wife reports that he started getting more cough, sputum, and fever.  He was also acting more confused.  He was smoking cigarettes until the last 10 days >> he was too sick to smoke.  He was told by his PCP that he has COPD, but has not been on inhaler therapy and family does not recall if he ever had PFT's.  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   PAST MEDICAL HISTORY :  Past Medical History  Diagnosis Date  . AAA (abdominal aortic aneurysm)     status post aortobifemoral bypass grafting by Dr. Leonette Mostharles fields  . Hyperlipidemia   . Stroke   . Renal artery stenosis   . Anorexia   . Fatigue   . Prostatitis   . ED (erectile dysfunction)   . Radicular low back pain   . Cerebral atherosclerosis 12/12/2011    Carotid Duplex - Left Buld/Proximal ICA-moderate amount fibrous plaque, 50-69% reduction    . Bruit 11/23/2010    Asymptomatic, Left Bulb/Proximal ICA - Severe fibrous plaque, less than or equal to 70% reduction  . CAD (coronary artery disease)     with PTCA  in 1991  . CHF (congestive heart failure) 11/18/2007    Lexiscan - EF 71%, perfusion defect in the inferior myocardial region is consistent with diaphragmatic attenuation, remaining myocardium demonstrates normal perfusion, no evidence of ischemia or infarct. No ECG changes, EKG negative for ischemia  . Hypertension 11/18/2007    2D Echo - EF >55%, no significant valvular pathology  . Thoracic aortic aneurysm     repaired by Dr. Melrose NakayamaBrian Bartel remotely  . Tobacco abuse    Past Surgical History  Procedure Laterality Date  . Abdominal aortic aneurysm repair  Oct. 6, 2009  . Pr vein bypass graft,aorto-fem-pop  Oct. 6, 2009    Bi-common iliac Artery BPG  . Thoracic aortic aneurysm repair  2001    by Dr. Laneta SimmersBartle  . Cardiac catheterization Left 10/27/1998    Angiographically patent coronary arteries, normal left ventricular function, left superior mediastinal mass by chest X-Ray 1 week prior   Prior to Admission medications   Medication Sig Start Date End Date Taking? Authorizing Provider  amLODipine (NORVASC) 10 MG tablet Take 1 tablet by mouth daily. 01/29/12  Historical Provider, MD  aspirin 81 MG tablet Take 81 mg by mouth daily.    Historical Provider, MD  atorvastatin (LIPITOR) 80 MG tablet Take 80 mg by mouth daily.    Historical Provider, MD  metoprolol (LOPRESSOR) 50 MG tablet Take 50 mg by mouth 2 (two) times daily.    Historical Provider, MD  niacin (NIASPAN) 500 MG CR tablet Take 500 mg by mouth at bedtime.    Historical Provider, MD  ramipril (ALTACE) 10 MG capsule Take 10 mg by mouth daily.    Historical Provider, MD   Allergies  Allergen Reactions  . Codeine     FAMILY HISTORY:  Family History  Problem Relation Age of Onset  . Cancer Father     Lung  . Cancer Brother     Prostate  . Heart disease Brother    . Hyperlipidemia Brother   . Hypertension Brother   . Cancer Brother     Prostate  . Hypertension Daughter   . Heart attack Daughter    SOCIAL HISTORY:  reports that he has been smoking Cigarettes.  He has a 60 pack-year smoking history. He has never used smokeless tobacco. He reports that he drinks alcohol. He reports that he does not use illicit drugs.  REVIEW OF SYSTEMS:  NA  SUBJECTIVE:   VITAL SIGNS: Temp:  [97.7 F (36.5 C)-100.5 F (38.1 C)] 99.3 F (37.4 C) (02/09 1145) Pulse Rate:  [105-129] 129 (02/09 1145) Resp:  [16-30] 16 (02/09 1145) BP: (72-128)/(41-66) 96/47 mmHg (02/09 1145) SpO2:  [94 %-100 %] 94 % (02/09 1145) FiO2 (%):  [50 %-100 %] 100 % (02/09 1120) Weight:  [165 lb 5.5 oz (75 kg)] 165 lb 5.5 oz (75 kg) (02/09 1130) HEMODYNAMICS:   VENTILATOR SETTINGS: Vent Mode:  [-] PRVC FiO2 (%):  [50 %-100 %] 100 % Set Rate:  [16 bmp] 16 bmp Vt Set:  [161 mL] 620 mL PEEP:  [5 cmH20-6 cmH20] 5 cmH20 INTAKE / OUTPUT: Intake/Output   None     PHYSICAL EXAMINATION: General:  Sedated and with NMB on vent Neuro:  As above HEENT:  Pupils pinpoint, Cardiovascular:  HSR RR Lungs:  Coarse rhonchi  With bloody secretions  Abdomen: Soft with faint bs Musculoskeletal: Intact Skin:  Warm, decreased pulses in lower ext  LABS:  CBC  Recent Labs Lab 2013/04/17 1005  WBC 6.7  HGB 13.9  HCT 41.0  PLT PENDING   Coag's No results found for this basename: APTT, INR,  in the last 168 hours BMET  Recent Labs Lab 04/17/2013 1005  NA 147  K 5.3  CL 109  CO2 16*  BUN 51*  CREATININE 3.55*  GLUCOSE 140*   Electrolytes  Recent Labs Lab 2013-04-17 1005  CALCIUM 8.6   Sepsis Markers No results found for this basename: LATICACIDVEN, PROCALCITON, O2SATVEN,  in the last 168 hours ABG  Recent Labs Lab 2013-04-17 1022  PHART 7.251*  PCO2ART 38.9  PO2ART 67.0*   Liver Enzymes No results found for this basename: AST, ALT, ALKPHOS, BILITOT, ALBUMIN,  in the  last 168 hours Cardiac Enzymes  Recent Labs Lab Apr 17, 2013 1008  PROBNP 5757.0*   Glucose No results found for this basename: GLUCAP,  in the last 168 hours  Imaging Dg Chest Portable 1 View  17-Apr-2013   CLINICAL DATA:  Post intubation  EXAM: PORTABLE CHEST - 1 VIEW  COMPARISON:  Prior film same day  FINDINGS: Cardiomediastinal silhouette is stable. Persistent consolidation in right upper lobe. Endotracheal  tube in place with tip 3.3 cm above the carina. No diagnostic pneumothorax.  IMPRESSION: Endotracheal tube in place. Persistent consolidation in right upper lobe.   Electronically Signed   By: Natasha Mead M.D.   On: Apr 17, 2013 10:54   Dg Chest Portable 1 View  April 17, 2013   CLINICAL DATA:  Chest pain  EXAM: PORTABLE CHEST - 1 VIEW  COMPARISON:  12/10/2007  FINDINGS: Cardiomediastinal silhouette is stable. There is dense infiltrate/consolidation in right upper lobe. Follow-up to resolution is recommended. No pulmonary edema.  IMPRESSION: Infiltrate/ consolidation in right upper lobe. Follow-up to resolution after treatment is recommended.   Electronically Signed   By: Natasha Mead M.D.   On: 04/17/13 10:30     CXR:  see above  ASSESSMENT / PLAN:  PULMONARY A: Acute hypoxic resp failure with Rt U Lobe dense pna.     COPD with continued tobacco abuse       P:   Vent bundle ABX Pan culture BD's  CT scan rule out post obstructive pna from mass  CARDIOVASCULAR A:  CAD (last 2 d 11/2007 ef 50%) PVD  P:  Check trop Check BNP Careful not to place femoral a line due to prior vascular surgery.  RENAL Lab Results  Component Value Date   CREATININE 3.55* Apr 17, 2013   CREATININE 1.58* 02/15/2013   CREATININE 1.67* 12/12/2007   CREATININE 1.89* 12/11/2007    A:  Acute renal failure P:   Check urine na Gentle hydration Check renal US in future if creatine does not improve.  GASTROINTESTINAL A:  GI Protection P:   PPI  HEMATOLOGIC A:  No acute issue P:  DVT  propholaxis  INFECTIOUS A:  PRESUMED PNA P:   See flows .  Cover for viral till panels back.  ENDOCRINE A:  No acute issue   P:     NEUROLOGIC A: Sedated on vent. Agitated when not sedated but intact. P:   Sedated to rass-1 as needed for vent tolerance.  TODAY'S SUMMARY:  73 yo vasculopath with 4 days of increasing orthopnea , dyspnea and hypoxia who presented to PCP with hypoxia. Cxr with large pna and intubated in ED by EDP.     Brett Canales Minor ACNP [facility] PCC[facility] Pager [phone] till 3 pm If no answer page [phone] Apr 17, 2013, 11:59 AM   Reviewed above, examined pt, and agree with assessment/plan.  73 yo male smoker with recent viral illness and no likely with acute bacterial superinfection leading to severe sepsis and respiratory failure.  Has hx of CAD and peripheral artery disease.  Will treat for CAP and possible staph aureus infection with zithromax, rocephin, and vancomycin.  Will also presumptively treat for influenza with tamiflu pending respiratory viral panel.  Not sure what baseline creatinine is >> will check FeNA, and may need renal u/s.  Defer CVL placement for now.  If he develops hypotension or if lactic acid elevated, then will likely need CVL placement.  Updated family about pt's current status and plan of care.  CC time 60 minutes.  Coralyn Helling, MD [facility] Pulmonary/Critical Care Apr 17, 2013, 12:02 PM Pager:  [phone] After 3pm call: [phone]

## 2013-04-12 NOTE — ED Provider Notes (Signed)
I saw and evaluated the patient, reviewed the resident's note and I agree with the findings and plan.   CRITICAL CARE Performed by: Lyanne CoAMPOS,Audrea Bolte M Total critical care time: 35 Critical care time was exclusive of separately billable procedures and treating other patients. Critical care was necessary to treat or prevent imminent or life-threatening deterioration. Critical care was time spent personally by me on the following activities: development of treatment plan with patient and/or surrogate as well as nursing, discussions with consultants, evaluation of patient's response to treatment, examination of patient, obtaining history from patient or surrogate, ordering and performing treatments and interventions, ordering and review of laboratory studies, ordering and review of radiographic studies, pulse oximetry and re-evaluation of patient's condition.  INTUBATION Performed by: Lyanne CoAMPOS,Mikaylah Libbey M Required items: required blood products, implants, devices, and special equipment available Patient identity confirmed: provided demographic data and hospital-assigned identification number Time out: Immediately prior to procedure a "time out" was called to verify the correct patient, procedure, equipment, support staff and site/side marked as required. Indications: Respiratory failure Intubation method: Glidescope Laryngoscopy  Preoxygenation: BVM Sedatives: Etomidate Paralytic: Succinylcholine Tube Size: 7.5 cuffed Post-procedure assessment: chest rise and ETCO2 monitor Breath sounds: equal and absent over the epigastrium Tube secured with: ETT holder Chest x-ray interpreted by radiologist and me. Chest x-ray findings: endotracheal tube in appropriate position Patient tolerated the procedure well with no immediate complications.  Intubated for worsening respiratory status despite BiPAP.  Unable to tolerate BiPAP.  Patient with community acquired pneumonia.  Intubated.  Family updated.  Patient's  prognosis critical and guarded.        Lyanne CoKevin M Valma Rotenberg, MD 04/28/2013 [phone]

## 2013-04-12 NOTE — Progress Notes (Signed)
eLink Physician-Brief Progress Note Patient Name: Carl PearRobert L Peretz DOB: 02/03/1941 MRN: [id number]  Date of Service  04/22/2013   HPI/Events of Note   Pt remains tenuous, hypotensive, acidosis. Oliguria with progressive renal failure.   eICU Interventions  - push bicarb and starrt gtt - liberalize Vt to increase Ve and hopefully address acidosis - will possibly need HD catheter and CVVHD if BP will allow - I will ask Mr Tanja PortBabcock, ACNP to eval at bedside.    Intervention Category Major Interventions: Sepsis - evaluation and management  Tanique Matney,Trentan S. 04/17/2013, 6:17 PM

## 2013-04-12 NOTE — Consult Note (Addendum)
HPI: I was asked by Dr. Malvin Johns to see Carl Gomez who is a 73 y.o. male who presented to Dr Delfina Redwood today and was transferred to the ED.  His symptoms  reportedly included a one week hx of cough and congestion,  Dyspnea.  He  was hypoxemic and CXR revealed a large RUL PNA.  We are asked to see patient due to his acidosis.  He is in shock and has VDRF.  He has a history of hypertension and was on an ace inhibitor PTA.  He reportedly underwent a aortobiiliac bypass graft and left renal artery bypass for aneurysm in 2009.  Past Medical History  Diagnosis Date  . AAA (abdominal aortic aneurysm)     status post aortobifemoral bypass grafting by Dr. Juanda Crumble fields  . Hyperlipidemia   . Stroke   . Renal artery stenosis   . Anorexia   . Fatigue   . Prostatitis   . ED (erectile dysfunction)   . Radicular low back pain   . Cerebral atherosclerosis 12/12/2011    Carotid Duplex - Left Buld/Proximal ICA-moderate amount fibrous plaque, 50-69% reduction  . Bruit 11/23/2010    Asymptomatic, Left Bulb/Proximal ICA - Severe fibrous plaque, less than or equal to 70% reduction  . CAD (coronary artery disease)     with PTCA  in 1991  . CHF (congestive heart failure) 11/18/2007    Lexiscan - EF 71%, perfusion defect in the inferior myocardial region is consistent with diaphragmatic attenuation, remaining myocardium demonstrates normal perfusion, no evidence of ischemia or infarct. No ECG changes, EKG negative for ischemia  . Hypertension 11/18/2007    2D Echo - EF >55%, no significant valvular pathology  . Thoracic aortic aneurysm     repaired by Dr. Pamalee Leyden remotely  . Tobacco abuse    Past Surgical History  Procedure Laterality Date  . Abdominal aortic aneurysm repair  Oct. 6, 2009  . Pr vein bypass graft,aorto-fem-pop  Oct. 6, 2009    Bi-common iliac Artery BPG  . Thoracic aortic aneurysm repair  2001    by Dr. Cyndia Bent  . Cardiac catheterization Left 10/27/1998    Angiographically patent  coronary arteries, normal left ventricular function, left superior mediastinal mass by chest X-Ray 1 week prior   Social History:  reports that he has been smoking Cigarettes.  He has a 60 pack-year smoking history. He has never used smokeless tobacco. He reports that he drinks alcohol. He reports that he does not use illicit drugs. Allergies:  Allergies  Allergen Reactions  . Codeine    Family History  Problem Relation Age of Onset  . Cancer Father     Lung  . Cancer Brother     Prostate  . Heart disease Brother   . Hyperlipidemia Brother   . Hypertension Brother   . Cancer Brother     Prostate  . Hypertension Daughter   . Heart attack Daughter     Medications:  Prior to Admission:  Prescriptions prior to admission  Medication Sig Dispense Refill  . amLODipine (NORVASC) 10 MG tablet Take 1 tablet by mouth daily.      Marland Kitchen aspirin 81 MG tablet Take 81 mg by mouth daily.      Marland Kitchen atorvastatin (LIPITOR) 80 MG tablet Take 80 mg by mouth daily.      . metoprolol (LOPRESSOR) 50 MG tablet Take 50 mg by mouth 2 (two) times daily.      . niacin (NIASPAN) 500 MG CR tablet Take 500  mg by mouth at bedtime.      . ramipril (ALTACE) 10 MG capsule Take 10 mg by mouth daily.       Scheduled: . antiseptic oral rinse  15 mL Mouth Rinse QID  . artificial tears  1 application Both Eyes S8O  . atorvastatin  80 mg Per Tube Daily  . [START ON 08-May-2013] cefTAZidime (FORTAZ)  IV  1 g Intravenous Q24H  . cefTAZidime (FORTAZ)  IV  2 g Intravenous Once  . chlorhexidine  15 mL Mouth Rinse BID  . cisatracurium  0.05 mg/kg Intravenous Once  . hydrocortisone sod succinate (SOLU-CORTEF) inj  50 mg Intravenous Q6H  . insulin aspart  0-15 Units Subcutaneous Q4H  . [START ON 04/18/2013] levofloxacin (LEVAQUIN) IV  500 mg Intravenous Q48H  . lidocaine (cardiac) 100 mg/44m      . oseltamivir  75 mg Per Tube BID  . pantoprazole sodium  40 mg Per Tube Daily  . succinylcholine      . [START ON 2Mar 07, 2015  vancomycin  750 mg Intravenous Q24H   ROS:not obtainable Blood pressure 79/36, pulse 107, temperature 96.5 F (35.8 C), temperature source Rectal, resp. rate 35, height 5' 10"  (1.778 m), weight 75 kg (165 lb 5.5 oz), SpO2 90.00%.  General appearance: intubated, paralyzed and sedated Head: Normocephalic, without obvious abnormality, atraumatic ar canals both ears and  GLM:BEMLand slightly protuberaqnt Extremities: No edema Skin: acral discoloration of feet Neurologic: paralyzed Results for orders placed during the hospital encounter of 04/29/2013 (from the past 48 hour(s))  CBC WITH DIFFERENTIAL     Status: Abnormal   Collection Time    04/26/2013 10:05 AM      Result Value Range   WBC 6.7  4.0 - 10.5 K/uL   RBC 4.52  4.22 - 5.81 MIL/uL   Hemoglobin 13.9  13.0 - 17.0 g/dL   HCT 41.0  39.0 - 52.0 %   MCV 90.7  78.0 - 100.0 fL   MCH 30.8  26.0 - 34.0 pg   MCHC 33.9  30.0 - 36.0 g/dL   RDW 15.5  11.5 - 15.5 %   Platelets 107 (*) 150 - 400 K/uL   Comment: PLATELET COUNT CONFIRMED BY SMEAR   Neutrophils Relative % 72  43 - 77 %   Lymphocytes Relative 11 (*) 12 - 46 %   Monocytes Relative 17 (*) 3 - 12 %   Eosinophils Relative 0  0 - 5 %   Basophils Relative 0  0 - 1 %   Neutro Abs 4.9  1.7 - 7.7 K/uL   Lymphs Abs 0.7  0.7 - 4.0 K/uL   Monocytes Absolute 1.1 (*) 0.1 - 1.0 K/uL   Eosinophils Absolute 0.0  0.0 - 0.7 K/uL   Basophils Absolute 0.0  0.0 - 0.1 K/uL   RBC Morphology BURR CELLS     Comment: ELLIPTOCYTES   WBC Morphology TOXIC GRANULATION     Comment: INCREASED BANDS (>20% BANDS)  BASIC METABOLIC PANEL     Status: Abnormal   Collection Time    04/23/2013 10:05 AM      Result Value Range   Sodium 147  137 - 147 mEq/L   Potassium 5.3  3.7 - 5.3 mEq/L   Chloride 109  96 - 112 mEq/L   CO2 16 (*) 19 - 32 mEq/L   Glucose, Bld 140 (*) 70 - 99 mg/dL   BUN 51 (*) 6 - 23 mg/dL   Creatinine, Ser 3.55 (*) 0.50 -  1.35 mg/dL   Calcium 8.6  8.4 - 10.5 mg/dL   GFR calc non Af Amer 16  (*) >90 mL/min   GFR calc Af Amer 18 (*) >90 mL/min   Comment: (NOTE)     The eGFR has been calculated using the CKD EPI equation.     This calculation has not been validated in all clinical situations.     eGFR's persistently <90 mL/min signify possible Chronic Kidney     Disease.  PRO B NATRIURETIC PEPTIDE     Status: Abnormal   Collection Time    04/11/2013 10:08 AM      Result Value Range   Pro B Natriuretic peptide (BNP) 5757.0 (*) 0 - 125 pg/mL  POCT I-STAT TROPONIN I     Status: None   Collection Time    04/05/2013 10:08 AM      Result Value Range   Troponin i, poc 0.02  0.00 - 0.08 ng/mL   Comment 3            Comment: Due to the release kinetics of cTnI,     a negative result within the first hours     of the onset of symptoms does not rule out     myocardial infarction with certainty.     If myocardial infarction is still suspected,     repeat the test at appropriate intervals.  POCT I-STAT 3, BLOOD GAS (G3+)     Status: Abnormal   Collection Time    04/22/2013 10:22 AM      Result Value Range   pH, Arterial 7.251 (*) 7.350 - 7.450   pCO2 arterial 38.9  35.0 - 45.0 mmHg   pO2, Arterial 67.0 (*) 80.0 - 100.0 mmHg   Bicarbonate 17.1 (*) 20.0 - 24.0 mEq/L   TCO2 18  0 - 100 mmol/L   O2 Saturation 90.0     Acid-base deficit 9.0 (*) 0.0 - 2.0 mmol/L   Collection site RADIAL, ALLEN'S TEST ACCEPTABLE     Drawn by RT     Sample type ARTERIAL    MRSA PCR SCREENING     Status: Abnormal   Collection Time    04/21/2013 12:38 PM      Result Value Range   MRSA by PCR POSITIVE (*) NEGATIVE   Comment:            The GeneXpert MRSA Assay (FDA     approved for NASAL specimens     only), is one component of a     comprehensive MRSA colonization     surveillance program. It is not     intended to diagnose MRSA     infection nor to guide or     monitor treatment for     MRSA infections.     RESULT CALLED TO, READ BACK BY AND VERIFIED WITH:     D. MOYER RN 16:10 04/24/2013 ([email or web address])   GLUCOSE, CAPILLARY     Status: Abnormal   Collection Time    04/27/2013 12:50 PM      Result Value Range   Glucose-Capillary 157 (*) 70 - 99 mg/dL  POCT I-STAT 3, BLOOD GAS (G3+)     Status: Abnormal   Collection Time    04/11/2013  1:10 PM      Result Value Range   pH, Arterial 7.041 (*) 7.350 - 7.450   pCO2 arterial 74.5 (*) 35.0 - 45.0 mmHg   pO2, Arterial 432.0 (*) 80.0 - 100.0 mmHg  Bicarbonate 20.0  20.0 - 24.0 mEq/L   TCO2 22  0 - 100 mmol/L   O2 Saturation 100.0     Acid-base deficit 11.0 (*) 0.0 - 2.0 mmol/L   Patient temperature 99.7 F     Collection site RADIAL, ALLEN'S TEST ACCEPTABLE     Drawn by Operator     Sample type ARTERIAL     Comment NOTIFIED PHYSICIAN    LACTIC ACID, PLASMA     Status: Abnormal   Collection Time    04/24/2013  1:15 PM      Result Value Range   Lactic Acid, Venous 3.3 (*) 0.5 - 2.2 mmol/L  TRIGLYCERIDES     Status: None   Collection Time    04/20/2013  1:15 PM      Result Value Range   Triglycerides 145  <150 mg/dL  PROCALCITONIN     Status: None   Collection Time    04/19/2013  1:15 PM      Result Value Range   Procalcitonin 56.18     Comment:            Interpretation:     PCT >= 10 ng/mL:     Important systemic inflammatory response,     almost exclusively due to severe bacterial     sepsis or septic shock.     (NOTE)             ICU PCT Algorithm               Non ICU PCT Algorithm        ----------------------------     ------------------------------             PCT < 0.25 ng/mL                 PCT < 0.1 ng/mL         Stopping of antibiotics            Stopping of antibiotics           strongly encouraged.               strongly encouraged.        ----------------------------     ------------------------------           PCT level decrease by               PCT < 0.25 ng/mL           >= 80% from peak PCT           OR PCT 0.25 - 0.5 ng/mL          Stopping of antibiotics                                                 encouraged.          Stopping of antibiotics               encouraged.        ----------------------------     ------------------------------           PCT level decrease by              PCT >= 0.25 ng/mL           < 80% from peak PCT  AND PCT >= 0.5 ng/mL            Continuing antibiotics                                                  encouraged.           Continuing antibiotics                encouraged.        ----------------------------     ------------------------------         PCT level increase compared          PCT > 0.5 ng/mL             with peak PCT AND              PCT >= 0.5 ng/mL             Escalation of antibiotics                                              strongly encouraged.          Escalation of antibiotics            strongly encouraged.  POCT I-STAT 3, BLOOD GAS (G3+)     Status: Abnormal   Collection Time    04/07/2013  2:45 PM      Result Value Range   pH, Arterial 7.092 (*) 7.350 - 7.450   pCO2 arterial 52.9 (*) 35.0 - 45.0 mmHg   pO2, Arterial 76.0 (*) 80.0 - 100.0 mmHg   Bicarbonate 16.0 (*) 20.0 - 24.0 mEq/L   TCO2 18  0 - 100 mmol/L   O2 Saturation 87.0     Acid-base deficit 13.0 (*) 0.0 - 2.0 mmol/L   Patient temperature 99.7 F     Collection site RADIAL, ALLEN'S TEST ACCEPTABLE     Drawn by Operator     Sample type ARTERIAL     Comment NOTIFIED PHYSICIAN    GLUCOSE, CAPILLARY     Status: Abnormal   Collection Time    04/11/2013  4:34 PM      Result Value Range   Glucose-Capillary 154 (*) 70 - 99 mg/dL  BASIC METABOLIC PANEL     Status: Abnormal   Collection Time    04/27/2013  5:22 PM      Result Value Range   Sodium 144  137 - 147 mEq/L   Potassium 4.5  3.7 - 5.3 mEq/L   Chloride 112  96 - 112 mEq/L   CO2 16 (*) 19 - 32 mEq/L   Glucose, Bld 204 (*) 70 - 99 mg/dL   BUN 56 (*) 6 - 23 mg/dL   Creatinine, Ser 4.29 (*) 0.50 - 1.35 mg/dL   Calcium 6.8 (*) 8.4 - 10.5 mg/dL   GFR calc non Af Amer 13 (*) >90 mL/min   GFR calc Af Amer 15 (*) >90  mL/min   Comment: (NOTE)     The eGFR has been calculated using the CKD EPI equation.     This calculation has not been validated in all clinical situations.     eGFR's persistently <90 mL/min signify possible Chronic Kidney     Disease.  CBC WITH DIFFERENTIAL     Status: Abnormal   Collection Time    04/08/2013  5:22 PM      Result Value Range   WBC 1.1 (*) 4.0 - 10.5 K/uL   Comment: REPEATED TO VERIFY     CRITICAL RESULT CALLED TO, READ BACK BY AND VERIFIED WITH:     D MOYER,RN 1838 04/28/2013     BY WBOND   RBC 3.76 (*) 4.22 - 5.81 MIL/uL   Hemoglobin 11.4 (*) 13.0 - 17.0 g/dL   Comment: REPEATED TO VERIFY     DELTA CHECK NOTED     OK PER RN   HCT 34.9 (*) 39.0 - 52.0 %   MCV 92.8  78.0 - 100.0 fL   MCH 30.3  26.0 - 34.0 pg   MCHC 32.7  30.0 - 36.0 g/dL   RDW 16.0 (*) 11.5 - 15.5 %   Platelets 108 (*) 150 - 400 K/uL   Comment: REPEATED TO VERIFY     SPECIMEN CHECKED FOR CLOTS     CONSISTENT WITH PREVIOUS RESULT   Neutrophils Relative % 34 (*) 43 - 77 %   Lymphocytes Relative 60 (*) 12 - 46 %   Monocytes Relative 4  3 - 12 %   Eosinophils Relative 1  0 - 5 %   Basophils Relative 1  0 - 1 %   Neutro Abs 0.4 (*) 1.7 - 7.7 K/uL   Lymphs Abs 0.7  0.7 - 4.0 K/uL   Monocytes Absolute 0.0 (*) 0.1 - 1.0 K/uL   Eosinophils Absolute 0.0  0.0 - 0.7 K/uL   Basophils Absolute 0.0  0.0 - 0.1 K/uL   RBC Morphology BURR CELLS     Comment: POLYCHROMASIA PRESENT   Smear Review LARGE PLATELETS PRESENT    LACTIC ACID, PLASMA     Status: Abnormal   Collection Time    04/27/2013  6:00 PM      Result Value Range   Lactic Acid, Venous 3.1 (*) 0.5 - 2.2 mmol/L  POCT I-STAT 3, BLOOD GAS (G3+)     Status: Abnormal   Collection Time    04/26/2013  6:03 PM      Result Value Range   pH, Arterial 7.020 (*) 7.350 - 7.450   pCO2 arterial 66.2 (*) 35.0 - 45.0 mmHg   pO2, Arterial 87.0  80.0 - 100.0 mmHg   Bicarbonate 17.2 (*) 20.0 - 24.0 mEq/L   TCO2 19  0 - 100 mmol/L   O2 Saturation 90.0      Acid-base deficit 14.0 (*) 0.0 - 2.0 mmol/L   Patient temperature 98.3 F     Collection site ARTERIAL LINE     Drawn by Operator     Sample type ARTERIAL     Comment NOTIFIED PHYSICIAN    GLUCOSE, CAPILLARY     Status: Abnormal   Collection Time    04/21/2013  6:45 PM      Result Value Range   Glucose-Capillary 202 (*) 70 - 99 mg/dL  POCT I-STAT 3, BLOOD GAS (G3+)     Status: Abnormal   Collection Time    04/30/2013  6:55 PM      Result Value Range   pH, Arterial 7.083 (*) 7.350 - 7.450   pCO2 arterial 59.4 (*) 35.0 - 45.0 mmHg   pO2, Arterial 92.0  80.0 - 100.0 mmHg   Bicarbonate 17.8 (*) 20.0 - 24.0 mEq/L   TCO2 20  0 - 100 mmol/L  O2 Saturation 93.0     Acid-base deficit 12.0 (*) 0.0 - 2.0 mmol/L   Patient temperature 98.3 F     Collection site ARTERIAL LINE     Drawn by Operator     Sample type ARTERIAL     Comment NOTIFIED PHYSICIAN    CARBOXYHEMOGLOBIN     Status: Abnormal   Collection Time    04/11/2013  7:29 PM      Result Value Range   Total hemoglobin 8.2 (*) 13.5 - 18.0 g/dL   O2 Saturation 88.8     Carboxyhemoglobin 0.5  0.5 - 1.5 %   Methemoglobin 0.8  0.0 - 1.5 %  SODIUM, URINE, RANDOM     Status: None   Collection Time    04/18/2013  7:57 PM      Result Value Range   Sodium, Ur 23    CREATININE, URINE, RANDOM     Status: None   Collection Time    04/10/2013  7:57 PM      Result Value Range   Creatinine, Urine 324.54     Dg Chest Port 1 View  04/11/2013   CLINICAL DATA:  Central line placement  EXAM: PORTABLE CHEST - 1 VIEW  COMPARISON:  April 12, 2013 10:41 a.m.  FINDINGS: The heart size and mediastinal contours are stable. Endotracheal tube is identified distal tip 5.5 cm from carina. Left jugular central venous line is identified distal tip in the superior vena cava. There is no pneumothorax. Consolidation of the right upper lobe is slightly worse. Consolidation the left mid lung is worse compared prior exam. The visualized skeletal structures are stable.   IMPRESSION: Left jugular central venous line distal tip in superior vena cava. There is no pneumothorax. Consolidation of bilateral lungs worse compared to prior exam.   Electronically Signed   By: Abelardo Diesel M.D.   On: 04/26/2013 19:51   Dg Chest Portable 1 View  04/19/2013   CLINICAL DATA:  Post intubation  EXAM: PORTABLE CHEST - 1 VIEW  COMPARISON:  Prior film same day  FINDINGS: Cardiomediastinal silhouette is stable. Persistent consolidation in right upper lobe. Endotracheal tube in place with tip 3.3 cm above the carina. No diagnostic pneumothorax.  IMPRESSION: Endotracheal tube in place. Persistent consolidation in right upper lobe.   Electronically Signed   By: Lahoma Crocker M.D.   On: 04/24/2013 10:54   Dg Chest Portable 1 View  04/23/2013   CLINICAL DATA:  Chest pain  EXAM: PORTABLE CHEST - 1 VIEW  COMPARISON:  12/10/2007  FINDINGS: Cardiomediastinal silhouette is stable. There is dense infiltrate/consolidation in right upper lobe. Follow-up to resolution is recommended. No pulmonary edema.  IMPRESSION: Infiltrate/ consolidation in right upper lobe. Follow-up to resolution after treatment is recommended.   Electronically Signed   By: Lahoma Crocker M.D.   On: 04/19/2013 10:30   Dg Abd Portable 1v  04/19/2013   CLINICAL DATA:  NG tube placement.  EXAM: PORTABLE ABDOMEN - 1 VIEW  COMPARISON:  CT scan 02/18/2013.  FINDINGS: The NG tube tip is in the fundal region of the stomach. Moderate stool noted in the colon. No definite free air.  IMPRESSION: NG tube tip is in the fundal region of the stomach.   Electronically Signed   By: Kalman Jewels M.D.   On: 04/08/2013 15:27    Assessment:  1 Severe acidosis, Respiratory and metabolic 2 Pneumonia, sepsis and VDRF 3 AKI 4 Stage 3 CKD  Plan: This patient's severe shock may precludes  the ability to do CVVHD, but after discussion with Dr Lamonte Sakai and inability to treat acidosis with IV bicarb and family wishes, will proceed  Devyn Sheerin C 04/25/2013, 9:36  PM

## 2013-04-12 NOTE — Progress Notes (Signed)
ANTIBIOTIC CONSULT NOTE - INITIAL  Pharmacy Consult for levaquin, fortaz Indication: septic shock  Allergies  Allergen Reactions  . Codeine     Patient Measurements: Height: 6' (182.9 cm) Weight: 165 lb 5.5 oz (75 kg) IBW/kg (Calculated) : 77.6  Vital Signs: Temp: 99.7 F (37.6 C) (02/09 1200) Temp src: Rectal (02/09 1057) BP: 90/55 mmHg (02/09 1600) Pulse Rate: 121 (02/09 1335) Intake/Output from previous day:   Intake/Output from this shift:    Labs:  Recent Labs  04/19/2013 1005  WBC 6.7  HGB 13.9  PLT 107*  CREATININE 3.55*   Estimated Creatinine Clearance: 20 ml/min (by C-G formula based on Cr of 3.55). No results found for this basename: VANCOTROUGH, VANCOPEAK, VANCORANDOM, GENTTROUGH, GENTPEAK, GENTRANDOM, TOBRATROUGH, TOBRAPEAK, TOBRARND, AMIKACINPEAK, AMIKACINTROU, AMIKACIN,  in the last 72 hours     Medical History: Past Medical History  Diagnosis Date  . AAA (abdominal aortic aneurysm)     status post aortobifemoral bypass grafting by Dr. Leonette Most fields  . Hyperlipidemia   . Stroke   . Renal artery stenosis   . Anorexia   . Fatigue   . Prostatitis   . ED (erectile dysfunction)   . Radicular low back pain   . Cerebral atherosclerosis 12/12/2011    Carotid Duplex - Left Buld/Proximal ICA-moderate amount fibrous plaque, 50-69% reduction  . Bruit 11/23/2010    Asymptomatic, Left Bulb/Proximal ICA - Severe fibrous plaque, less than or equal to 70% reduction  . CAD (coronary artery disease)     with PTCA  in 1991  . CHF (congestive heart failure) 11/18/2007    Lexiscan - EF 71%, perfusion defect in the inferior myocardial region is consistent with diaphragmatic attenuation, remaining myocardium demonstrates normal perfusion, no evidence of ischemia or infarct. No ECG changes, EKG negative for ischemia  . Hypertension 11/18/2007    2D Echo - EF >55%, no significant valvular pathology  . Thoracic aortic aneurysm     repaired by Dr. Melrose Nakayama remotely   . Tobacco abuse     Medications:  Prescriptions prior to admission  Medication Sig Dispense Refill  . amLODipine (NORVASC) 10 MG tablet Take 1 tablet by mouth daily.      Marland Kitchen aspirin 81 MG tablet Take 81 mg by mouth daily.      Marland Kitchen atorvastatin (LIPITOR) 80 MG tablet Take 80 mg by mouth daily.      . metoprolol (LOPRESSOR) 50 MG tablet Take 50 mg by mouth 2 (two) times daily.      . niacin (NIASPAN) 500 MG CR tablet Take 500 mg by mouth at bedtime.      . ramipril (ALTACE) 10 MG capsule Take 10 mg by mouth daily.       Scheduled:  . antiseptic oral rinse  15 mL Mouth Rinse QID  . artificial tears  1 application Both Eyes Q8H  . atorvastatin  80 mg Per Tube Daily  . chlorhexidine  15 mL Mouth Rinse BID  . cisatracurium  0.05 mg/kg Intravenous Once  . hydrocortisone sod succinate (SOLU-CORTEF) inj  50 mg Intravenous Q6H  . insulin aspart  0-15 Units Subcutaneous Q4H  . lidocaine (cardiac) 100 mg/71ml      . oseltamivir  75 mg Per Tube BID  . pantoprazole sodium  40 mg Per Tube Daily  . sodium chloride  500 mL Intravenous Once  . succinylcholine      . [START ON 04/17/13] vancomycin  750 mg Intravenous Q24H     Assessment: 73 yo  male on antibiotics for PNA and now beginning septic shock protocol.  Antibiotics have been changed tfrom ceftriaxone and azithromycin to  fortaz and levaquin (pateint on vancomycin). SCr= 3.55 and CrCl 20,   Goal of Therapy:  Vancomycin trough level 15-20 mcg/ml  Plan:  -Fortaz 2gm IV x1 then 1gm IV q24hr -Levaquin 750mg  IV x1 then 500mg  IV q24hr -Will follow renal function, cultures and clinical progress  Harland Germanndrew Rosalita Carey, Pharm D 04/10/2013 6:05 PM

## 2013-04-12 NOTE — ED Notes (Signed)
2 liters w/o per dr Patria Manecampos going

## 2013-04-12 NOTE — Progress Notes (Signed)
Called to evaluate ABG.  Severe respiratory acidosis.  Will increase MV with bicarb drip (unable to increase MV much given auto-PEEP).  Changed to PCV and F/U ABG in 30 minutes.  Additional CC time 30 min.  Alyson ReedyWesam G. Toshiba Null, M.D. [facility]. Pager: [phone]. After hours pager: [phone].

## 2013-04-12 NOTE — Procedures (Signed)
Arterial Catheter Insertion Procedure Note Carl PearRobert L Gomez [id number] 05/26/1940  No radial options, failed, refractory shock , acidosis, no options fem only  Procedure: Insertion of Arterial Catheter  Indications: Blood pressure monitoring and Frequent blood sampling  Procedure Details Consent: Risks of procedure as well as the alternatives and risks of each were explained to the (patient/caregiver).  Consent for procedure obtained. and Unable to obtain consent because of emergent medical necessity. Time Out: Verified patient identification, verified procedure, site/side was marked, verified correct patient position, special equipment/implants available, medications/allergies/relevent history reviewed, required imaging and test results available.  Performed  Maximum sterile technique was used including antiseptics, cap, gloves, gown, hand hygiene, mask and sheet. Skin prep: Chlorhexidine; local anesthetic administered 20 gauge catheter was inserted into right femoral artery using the Seldinger technique.  Evaluation Blood flow good; BP tracing good. Complications: No apparent complications.   Nelda BucksFEINSTEIN,Carl J. 04/28/2013   US  Mcarthur Rossettianiel J. Tyson AliasFeinstein, MD, FACP Pgr: [phone]  Pulmonary & Critical Care

## 2013-04-12 NOTE — ED Notes (Signed)
Pt cont to be agitated family in room  cxray done

## 2013-04-12 NOTE — ED Notes (Signed)
Sob and cp for the last couple of days worse today went to [facility] [facility] and was pale cool diaphoretic/ confused upon ems arrival placed on o2 and only slightly better, w/ cpap pt pinked up and is better and more cognizent rales ronchi thru out

## 2013-04-12 NOTE — ED Notes (Signed)
Given to 2100

## 2013-04-12 NOTE — Procedures (Signed)
Central Venous Catheter Insertion Procedure Note Carl Gomez [id number] 07/15/1940  Procedure: Insertion of Central Venous Catheter Indications: Assessment of intravascular volume and Drug and/or fluid administration  Procedure Details Consent: Risks of procedure as well as the alternatives and risks of each were explained to the (patient/caregiver).  Consent for procedure obtained. Time Out: Verified patient identification, verified procedure, site/side was marked, verified correct patient position, special equipment/implants available, medications/allergies/relevent history reviewed, required imaging and test results available.  Performed  Maximum sterile technique was used including antiseptics, cap, gloves, gown, hand hygiene, mask and sheet. Skin prep: Chlorhexidine; local anesthetic administered A antimicrobial bonded/coated triple lumen catheter was placed in the left internal jugular vein using the Seldinger technique.  Evaluation Blood flow good Complications: No apparent complications Patient did tolerate procedure well. Chest X-ray ordered to verify placement.  CXR: pending.  Lewie ChamberGirguis, David 04/12/2013, 3:06 PM  Tolerated well Septic shock cvp needed Access pressors  [place]  Mcarthur RossettiDaniel J. Tyson AliasFeinstein, MD, FACP Pgr: [phone] [facility]

## 2013-04-13 ENCOUNTER — Inpatient Hospital Stay (HOSPITAL_COMMUNITY): Payer: Medicare Other

## 2013-04-13 LAB — RESPIRATORY VIRUS PANEL
ADENOVIRUS: NOT DETECTED
INFLUENZA A H3: DETECTED — AB
Influenza A H1: NOT DETECTED
Influenza A: DETECTED — AB
Influenza B: NOT DETECTED
Metapneumovirus: NOT DETECTED
PARAINFLUENZA 1 A: NOT DETECTED
PARAINFLUENZA 2 A: NOT DETECTED
Parainfluenza 3: NOT DETECTED
RESPIRATORY SYNCYTIAL VIRUS A: NOT DETECTED
RESPIRATORY SYNCYTIAL VIRUS B: NOT DETECTED
Rhinovirus: NOT DETECTED

## 2013-04-13 LAB — PATHOLOGIST SMEAR REVIEW

## 2013-04-13 LAB — GLUCOSE, CAPILLARY: GLUCOSE-CAPILLARY: 120 mg/dL — AB (ref 70–99)

## 2013-04-13 LAB — CORTISOL: Cortisol, Plasma: 49.4 ug/dL

## 2013-04-13 LAB — BASIC METABOLIC PANEL
BUN: 49 mg/dL — AB (ref 6–23)
CHLORIDE: 103 meq/L (ref 96–112)
CO2: 18 mEq/L — ABNORMAL LOW (ref 19–32)
Calcium: 5.3 mg/dL — CL (ref 8.4–10.5)
Creatinine, Ser: 3.76 mg/dL — ABNORMAL HIGH (ref 0.50–1.35)
GFR calc non Af Amer: 15 mL/min — ABNORMAL LOW (ref >90)
GFR, EST AFRICAN AMERICAN: 17 mL/min — AB (ref >90)
Glucose, Bld: 353 mg/dL — ABNORMAL HIGH (ref 70–99)
POTASSIUM: 4 meq/L (ref 3.7–5.3)
Sodium: 151 mEq/L — ABNORMAL HIGH (ref 137–147)

## 2013-04-13 MED ORDER — MORPHINE SULFATE 10 MG/ML IJ SOLN
10.0000 mg/h | INTRAMUSCULAR | Status: DC
  Administered 2013-04-13: 10 mg/h via INTRAVENOUS
  Filled 2013-04-13: qty 10

## 2013-04-13 MED ORDER — MORPHINE SULFATE 2 MG/ML IJ SOLN
2.0000 mg | Freq: Once | INTRAMUSCULAR | Status: AC
Start: 2013-04-13 — End: 2013-04-13
  Administered 2013-04-13: 2 mg via INTRAVENOUS

## 2013-04-13 MED ORDER — MORPHINE SULFATE 2 MG/ML IJ SOLN: 2.0000 mg | INTRAMUSCULAR | Status: DC | PRN

## 2013-04-15 LAB — CULTURE, BLOOD (ROUTINE X 2)

## 2013-04-16 NOTE — Discharge Summary (Signed)
Frederik PearRobert L Kuipers was a 73 y.o. male smoker admitted on 04/19/2013 from his PCP office with respiratory distress and hypoxia.  He had a virus for about a week prior to admission, but then suddenly got worse.  He was more confused, and had fever.  He also had more productive cough.  He was sent to the ER, and found to have Rt sided pneumonia.  He required intubation in the ER.  He was started on broad spectrum antibiotics.  He later developed refractory hypotension, and started on pressors agents.  He was also found to have refractory acidosis.  Nephrology was consulted to assess for CRRT.  His medical status continued to deteriorate.  Family was updated about poor prognosis, and opted for comfort measures.  He was subsequently extubated, and expired on 04/10/2013.  Final Diagnoses: 1) Acute respiratory failure 2) MRSA Pneumonia 3) MRSA Bacteremia 4) Influenza pneumonia 5) Acute encephalopathy 6) Septic shock 7) Metabolic acidosis 8) Acute kidney injury 9) Hypernatremia 10) Hyperglycemia 11) Pancytopenia 12) Acute on chronic systolic heart failure 13) Tobacco abuse 14) Hx of CAD 15) Probable COPD  Coralyn HellingVineet Ewell Benassi, MD 04/16/2013, 3:53 PM

## 2013-04-26 ENCOUNTER — Telehealth: Payer: Self-pay | Admitting: Vascular Surgery

## 2013-04-26 NOTE — Telephone Encounter (Signed)
Su HiltRoberts wife Steward DroneBrenda called us to let us know he passed away on 01-25-14. He had an appointment on 02/24/14 but we already cancelled it.

## 2013-05-02 NOTE — Progress Notes (Signed)
Pt has been started on a morphine drip for comfort and has all family in the room at this time.  RT did not do vent check at this time.

## 2013-05-02 NOTE — Significant Event (Signed)
Family updated. Has continued to decline. Family understands and wants to focus on comfort.  Plan -start morphine gtt. Assure comfort.  -revisit further withdrawal of support after we assure comfort goals are met   Marni Griffon ACNP-BC [place] Pager # [phone] OR # [phone] if no answer

## 2013-05-02 NOTE — Progress Notes (Signed)
Critical value of calcium 5.3 called to Dr Darrol AngelSimons

## 2013-05-02 DEATH — deceased

## 2014-02-24 ENCOUNTER — Other Ambulatory Visit (HOSPITAL_COMMUNITY): Payer: Medicare Other

## 2014-02-24 ENCOUNTER — Ambulatory Visit: Payer: Medicare Other | Admitting: Vascular Surgery

## 2014-05-26 IMAGING — CR DG CHEST 1V PORT
1 series · 1 of 1 positions shown · non-contrast
Comparison: 12/10/2007

CLINICAL DATA: Chest pain

EXAM:
PORTABLE CHEST - 1 VIEW

[AP]
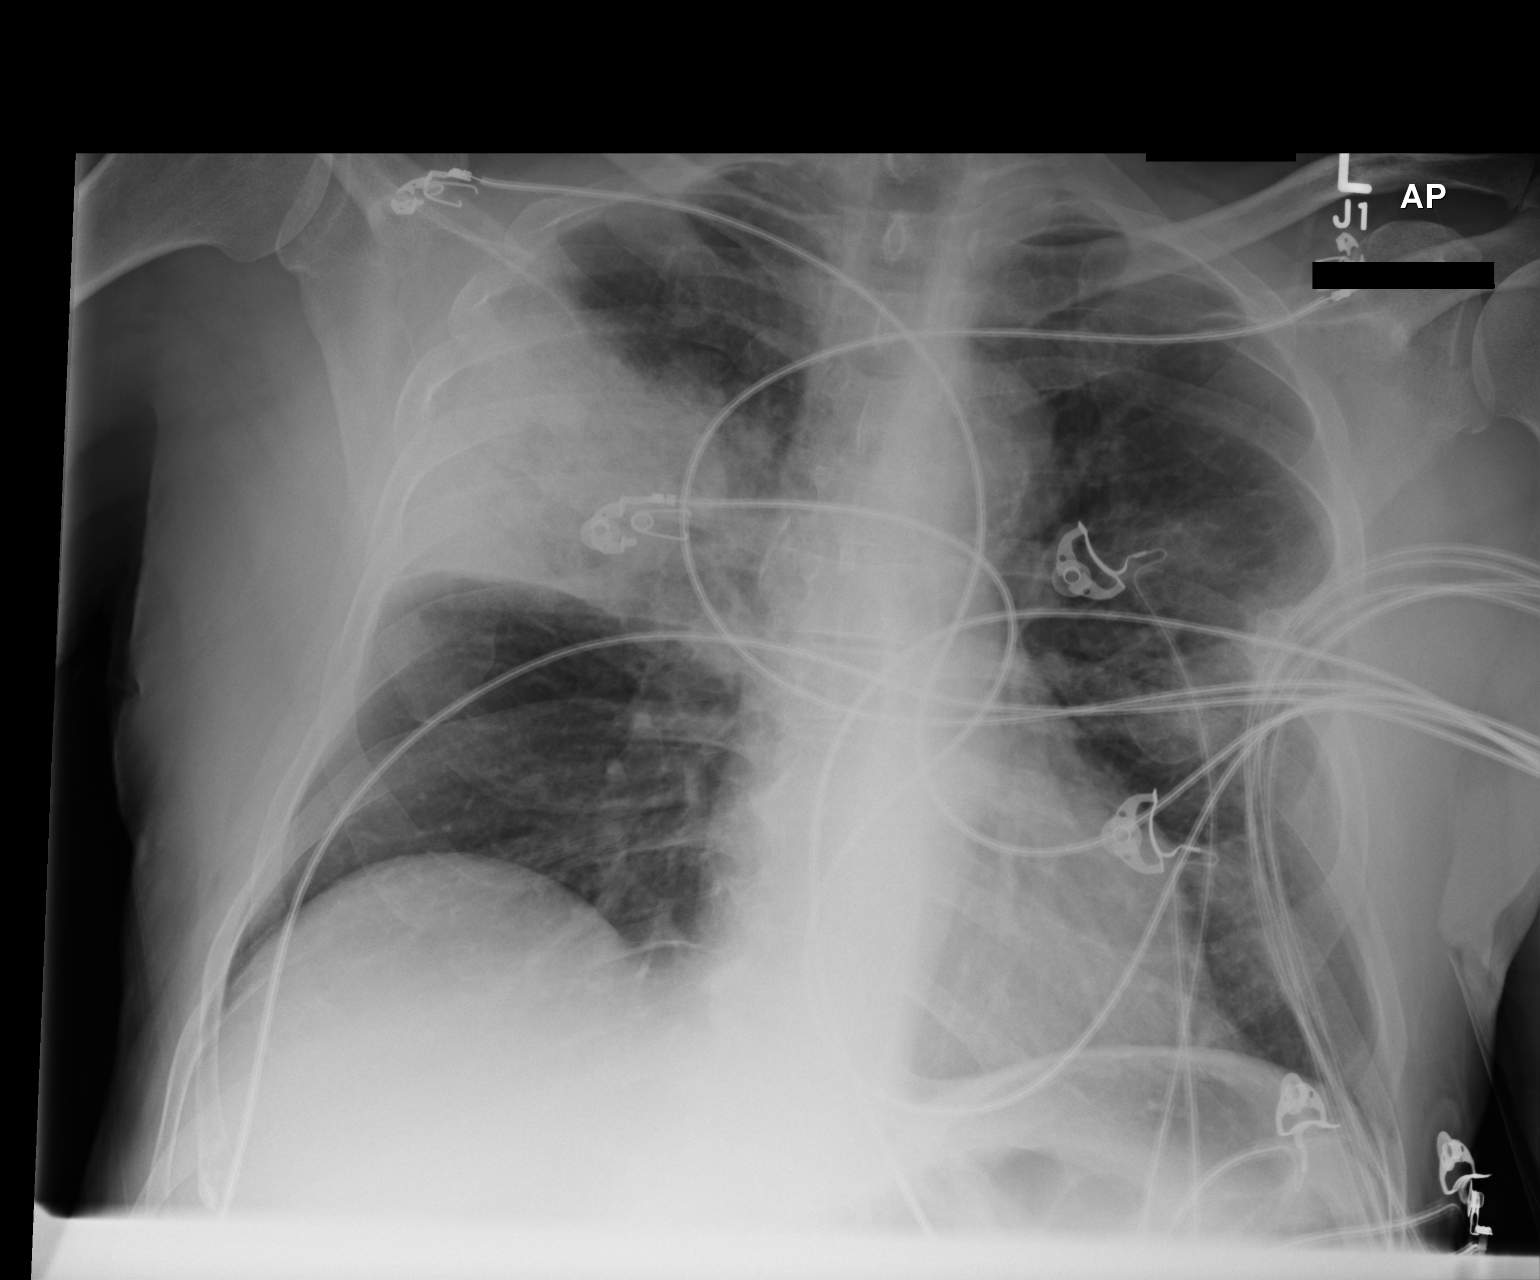

[1 of 1 positions shown; findings below may reference images not displayed]

FINDINGS: Cardiomediastinal silhouette is stable. There is dense
infiltrate/consolidation in right upper lobe. Follow-up to
resolution is recommended. No pulmonary edema.
IMPRESSION: Infiltrate/ consolidation in right upper lobe. Follow-up to
resolution after treatment is recommended.
